# Patient Record
Sex: Female | Born: 2000 | Race: Black or African American | Hispanic: No | Marital: Single | Smoking: Never smoker
Health system: Southern US, Community
[De-identification: ages and names within clinical notes are randomized; demographics above are authoritative.]

## PROBLEM LIST (undated history)

## (undated) DIAGNOSIS — J302 Other seasonal allergic rhinitis: Secondary | ICD-10-CM

## (undated) DIAGNOSIS — J45909 Unspecified asthma, uncomplicated: Secondary | ICD-10-CM

## (undated) HISTORY — DX: Other seasonal allergic rhinitis: J30.2

## (undated) HISTORY — PX: WRIST SURGERY: SHX841

---
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
## 2018-04-13 ENCOUNTER — Ambulatory Visit (INDEPENDENT_AMBULATORY_CARE_PROVIDER_SITE_OTHER): Payer: 59 | Admitting: Family Medicine

## 2018-04-13 ENCOUNTER — Encounter: Payer: Self-pay | Admitting: Family Medicine

## 2018-04-13 VITALS — BP 110/70 | HR 67 | Temp 98.5°F | Resp 16 | Ht 66.0 in | Wt 144.0 lb

## 2018-04-13 DIAGNOSIS — J069 Acute upper respiratory infection, unspecified: Secondary | ICD-10-CM

## 2018-04-13 DIAGNOSIS — J45909 Unspecified asthma, uncomplicated: Secondary | ICD-10-CM | POA: Insufficient documentation

## 2018-04-13 DIAGNOSIS — J4520 Mild intermittent asthma, uncomplicated: Secondary | ICD-10-CM | POA: Diagnosis not present

## 2018-04-13 DIAGNOSIS — J309 Allergic rhinitis, unspecified: Secondary | ICD-10-CM | POA: Diagnosis not present

## 2018-04-13 DIAGNOSIS — H1010 Acute atopic conjunctivitis, unspecified eye: Secondary | ICD-10-CM | POA: Diagnosis not present

## 2018-04-13 DIAGNOSIS — J302 Other seasonal allergic rhinitis: Secondary | ICD-10-CM | POA: Insufficient documentation

## 2018-04-13 MED ORDER — BECLOMETHASONE DIPROPIONATE 80 MCG/ACT NA AERS: 2.0000 | INHALATION_SPRAY | NASAL | 5 refills | Status: DC

## 2018-04-13 MED ORDER — ALBUTEROL SULFATE HFA 108 (90 BASE) MCG/ACT IN AERS: 2.0000 | INHALATION_SPRAY | Freq: Four times a day (QID) | RESPIRATORY_TRACT | 0 refills | Status: DC | PRN

## 2018-04-13 NOTE — Patient Instructions (Signed)
I suspect your symptoms are related to a viral illness and recommend treating your symptoms at this point.  Mucinex DM for congestion and cough, drink extra water, use salt water gargles for throat irritation and Tylenol or Ibuprofen for aches and pains.   Use the steroid nasal spray for nasal congestion.  I refilled your albuterol inhaler.  You may also take Zyrtec.   Call if you are not improving by days 7-10 of your illness or if you develop fever, wheezing or worsening symptoms.

## 2018-04-13 NOTE — Progress Notes (Signed)
Chief Complaint  Patient presents with  . NP    np congestion, cough, drainage, ear ache X 1 week    Subjective:  Sandra Joseph is a 17 y.o. female who is new to the practice and presents for a one week history of nasal congestion, ears clogged, post nasal drainage, and cough. States her cough was initially productive of yellowish sputum but now clear and significantly improved.   Denies fever, chills, sinus pressure, headache, chest pain, shortness of breath, wheezing, abdominal pain, N/V/D.   Underlying asthma since childhood. Mild, intermittent. Seasonal allergies.  Does not smoke.   Works at [employer].   Treatment to date: Mucinex .  Denies sick contacts.  No other aggravating or relieving factors.  No other c/o.  LMP: March 23 2018. Is not sexually active.   ROS as in subjective.   Objective: Vitals:   04/13/18 1120  BP: 110/70  Pulse: 67  Resp: 16  Temp: 98.5 F (36.9 C)  SpO2: 99%    General appearance: Alert, WD/WN, no distress, mildly ill appearing                             Skin: warm, no rash                           Head: no sinus tenderness                            Eyes: conjunctiva normal, corneas clear, PERRLA                            Ears: pearly TMs, external ear canals normal                          Nose: septum midline, turbinates swollen, with erythema and clear discharge             Mouth/throat: MMM, tongue normal, mild pharyngeal erythema                           Neck: supple, no adenopathy, no thyromegaly, nontender                          Heart: RRR, normal S1, S2, no murmurs                         Lungs: CTA bilaterally, no wheezes, rales, or rhonchi      Assessment: Acute URI  Mild intermittent asthma without complication - Plan: albuterol (PROVENTIL HFA;VENTOLIN HFA) 108 (90 Base) MCG/ACT inhaler  Allergic rhinoconjunctivitis - Plan: Beclomethasone Dipropionate (QNASL) 80 MCG/ACT AERS    Plan: Discussed diagnosis and  treatment of URI. She is noticing improvement. Suggested symptomatic OTC remedies with Mucinex and Zyrtec and refilled nasal steroid spray as well as albuterol. Asthma is well controlled. No recent flares. Allergies seasonal.  Nasal saline spray for congestion.  Tylenol or Ibuprofen OTC for fever and malaise.  Call/return in 2-3 days if symptoms aren't resolving.

## 2018-08-27 ENCOUNTER — Other Ambulatory Visit: Payer: Self-pay | Admitting: Family Medicine

## 2018-08-27 DIAGNOSIS — J4520 Mild intermittent asthma, uncomplicated: Secondary | ICD-10-CM

## 2018-08-27 NOTE — Telephone Encounter (Signed)
Is this okay to refill? 

## 2018-08-30 IMAGING — CR DG CHEST 2V
2 series · 2 of 2 positions shown · non-contrast
Comparison: Chest radiograph performed 05/27/2008

CLINICAL DATA: Subacute onset of cough.  Fever.

EXAM:
CHEST  2 VIEW

[chest pa]
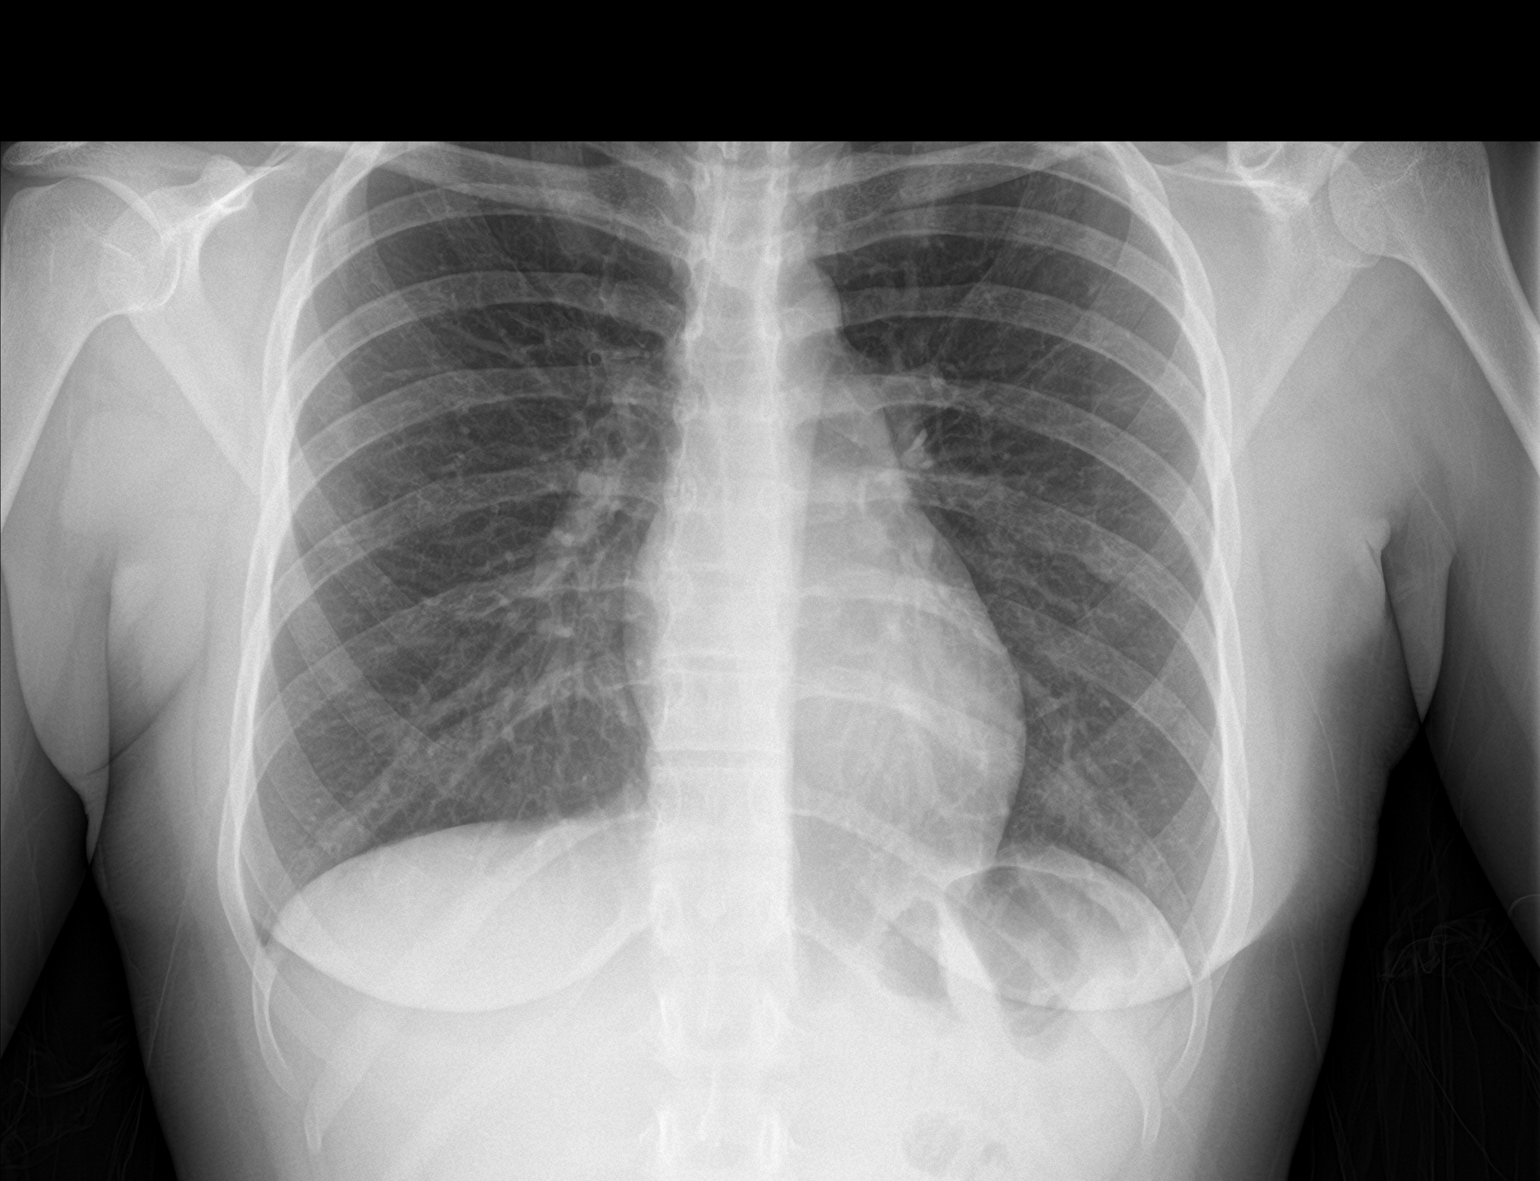

[chest lat]
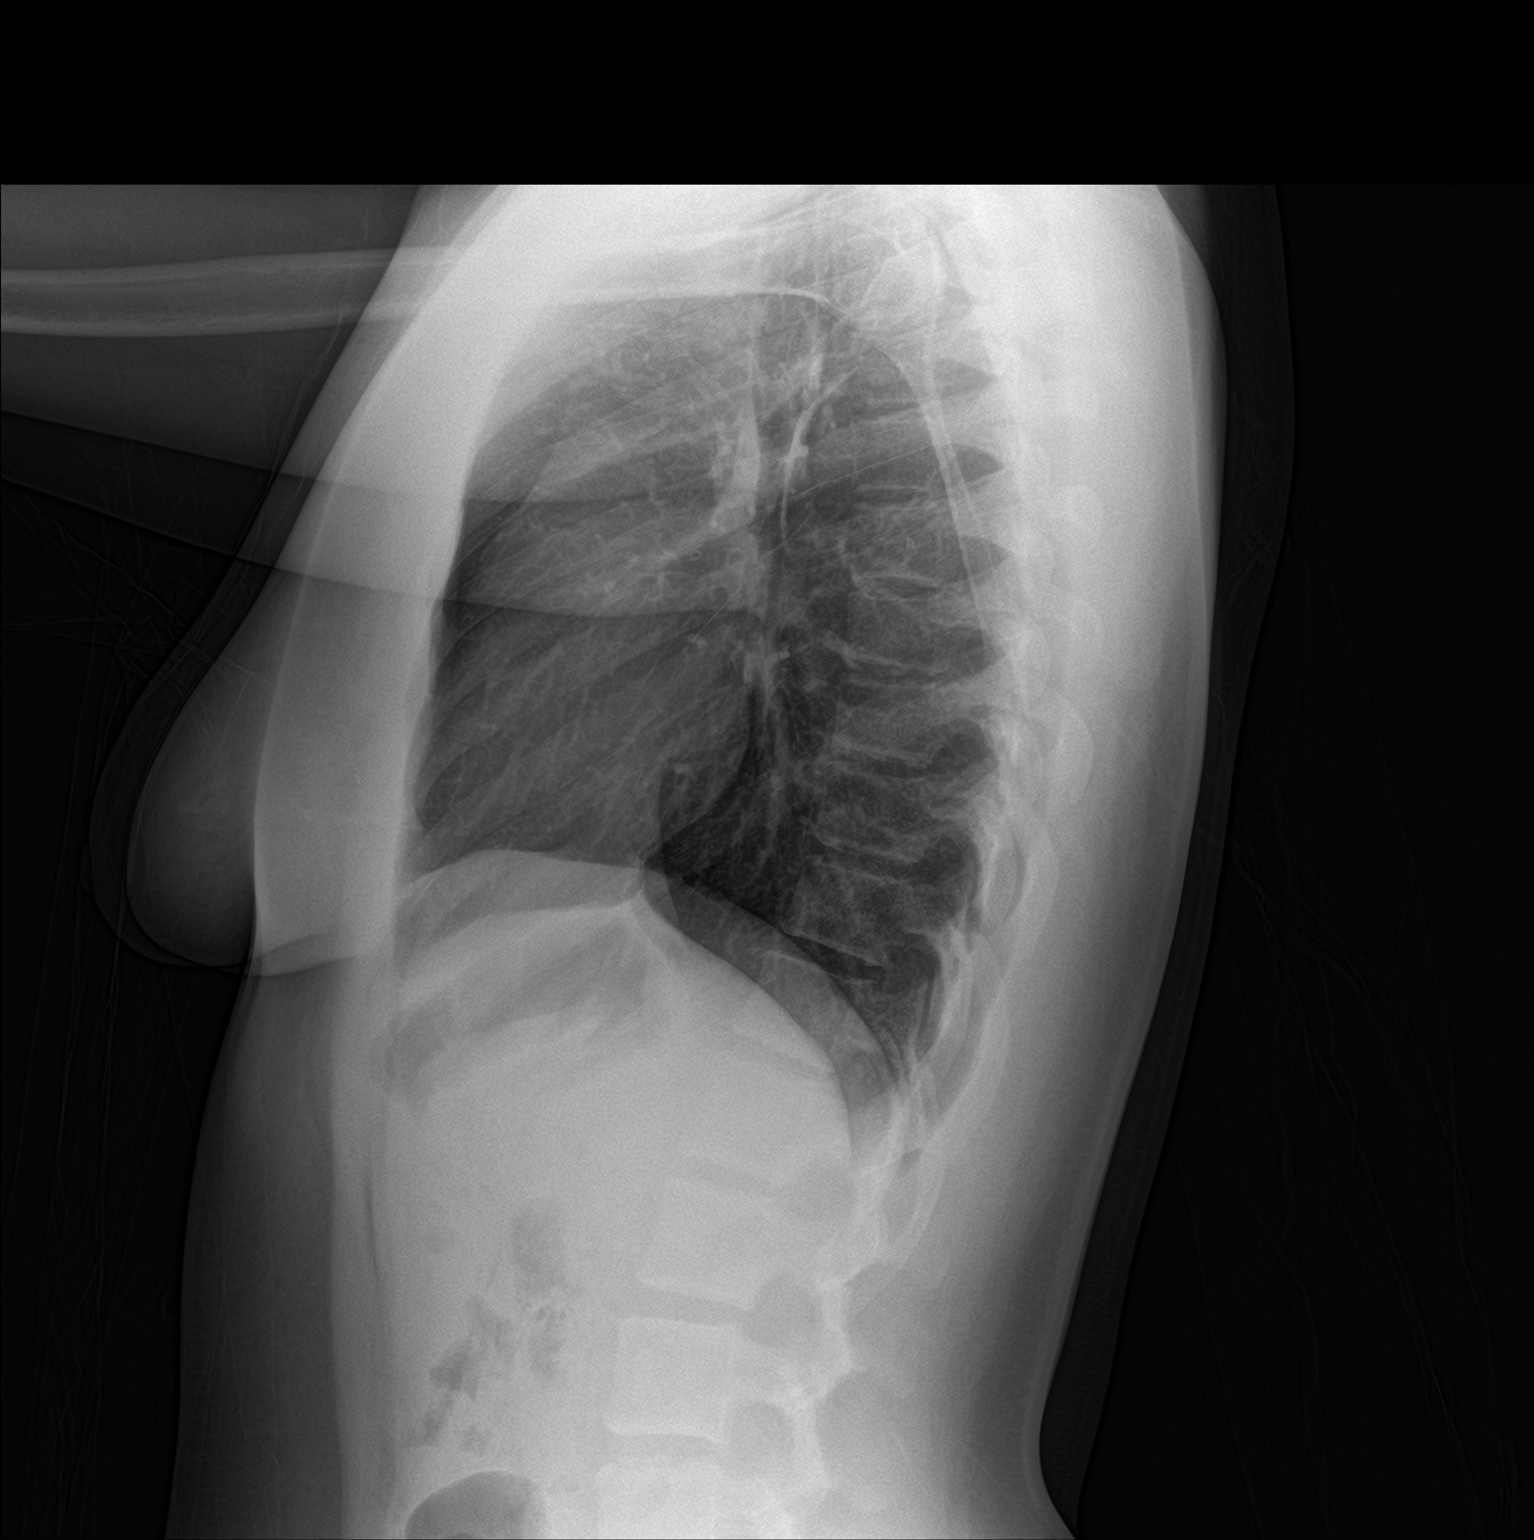

[2 of 2 positions shown; findings below may reference images not displayed]

FINDINGS: The lungs are well-aerated and clear. There is no evidence of focal
opacification, pleural effusion or pneumothorax.

The heart is normal in size; the mediastinal contour is within
normal limits. No acute osseous abnormalities are seen.
IMPRESSION: No acute cardiopulmonary process seen.

## 2019-01-24 ENCOUNTER — Encounter: Payer: Self-pay | Admitting: Family Medicine

## 2019-01-24 ENCOUNTER — Ambulatory Visit (INDEPENDENT_AMBULATORY_CARE_PROVIDER_SITE_OTHER): Payer: 59 | Admitting: Family Medicine

## 2019-01-24 ENCOUNTER — Other Ambulatory Visit: Payer: Self-pay

## 2019-01-24 VITALS — BP 120/70 | HR 64 | Temp 97.1°F | Ht 66.5 in | Wt 141.6 lb

## 2019-01-24 DIAGNOSIS — Z113 Encounter for screening for infections with a predominantly sexual mode of transmission: Secondary | ICD-10-CM | POA: Diagnosis not present

## 2019-01-24 DIAGNOSIS — Z0000 Encounter for general adult medical examination without abnormal findings: Secondary | ICD-10-CM | POA: Diagnosis not present

## 2019-01-24 DIAGNOSIS — Z23 Encounter for immunization: Secondary | ICD-10-CM | POA: Diagnosis not present

## 2019-01-24 NOTE — Progress Notes (Signed)
Subjective:    Patient ID: Sandra Joseph, female    DOB: [birth year], 18 y.o.   MRN: [id number]  HPI Chief Complaint  Patient presents with  . cpe    cpe with pap, end of july hit her head and been having headaches   She is fairly new to the practice and here for a complete physical exam. Previous medical care: no regular care in years  Last CPE: years ago   Other providers: None   Asthma- has needed albuterol inhaler 3 times over the past 1-2 weeks. Not at night.  Tightness in her chest while exerting herself and wearing a mask. Has never been on a preventive inhaler. Denies having symptoms currently.   Allergies are well controlled on current medication regimen.   Sexually active. Uses condoms  No hx of STIs.    Social history: Lives with her mother, works at [employer]  Denies smoking, drinking alcohol, drug use  Diet: fried foods  Excerise: 4 days per week   Immunizations: Tdap overdue   Health maintenance:  Last Gynecological Exam: never  Last Menstrual cycle: 01/14/2019 Periods are regular. Last for 5 days.  First period at age 18.  Pregnancies: 0 Last Dental Exam: last year  Last Eye Exam: winter 2019   Wears seatbelt always, smoke detectors in home and functioning, does not text while driving and feels safe in home environment.   Reviewed allergies, medications, past medical, surgical, family, and social history.    Review of Systems Review of Systems Constitutional: -fever, -chills, -sweats, -unexpected weight change,-fatigue ENT: -runny nose, -ear pain, -sore throat Cardiology:  -chest pain, -palpitations, -edema Respiratory: -cough, -shortness of breath, -wheezing Gastroenterology: -abdominal pain, -nausea, -vomiting, -diarrhea, -constipation  Hematology: -bleeding or bruising problems Musculoskeletal: -arthralgias, -myalgias, -joint swelling, -back pain Ophthalmology: -vision changes Urology: -dysuria, -difficulty urinating, -hematuria,  -urinary frequency, -urgency Neurology: -headache, -weakness, -tingling, -numbness       Objective:   Physical Exam BP 120/70   Pulse 64   Temp (!) 97.1 F (36.2 C)   Ht 5' 6.5" (1.689 m)   Wt 141 lb 9.6 oz (64.2 kg)   LMP 01/14/2019   BMI 22.51 kg/m   General Appearance:    Alert, cooperative, no distress, appears stated age  Head:    Normocephalic, without obvious abnormality, atraumatic  Eyes:    PERRL, conjunctiva/corneas clear, EOM's intact, fundi    benign  Ears:    Normal TM's and external ear canals  Nose:   Mask in place   Throat:   Mask in place   Neck:   Supple, no lymphadenopathy;  thyroid:  no   enlargement/tenderness/nodules; no carotid   bruit or JVD  Back:    Spine nontender, no curvature, ROM normal, no CVA     tenderness  Lungs:     Clear to auscultation bilaterally without wheezes, rales or     ronchi; respirations unlabored  Chest Wall:    No tenderness or deformity   Heart:    Regular rate and rhythm, S1 and S2 normal, no murmur, rub   or gallop  Breast Exam:    Declines   Abdomen:     Soft, non-tender, nondistended, normoactive bowel sounds,    no masses, no hepatosplenomegaly  Genitalia:    Normal external genitalia without lesions.  BUS and vagina normal; cervix without lesions, or cervical motion tenderness. Mild white-yellowish vaginal discharge.  Uterus and adnexa not enlarged, nontender, no masses.  Pap not indicated.  Rectal:    Not performed due to age<40 and no related complaints  Extremities:   No clubbing, cyanosis or edema  Pulses:   2+ and symmetric all extremities  Skin:   Skin color, texture, turgor normal, no rashes or lesions  Lymph nodes:   Cervical, supraclavicular, and axillary nodes normal  Neurologic:   CNII-XII intact, normal strength, sensation and gait; reflexes 2+ and symmetric throughout          Psych:   Normal mood, affect, hygiene and grooming.         Assessment & Plan:  Routine general medical examination at a  health care facility - Plan: CBC with Differential/Platelet, Comprehensive metabolic panel, T4, free, TSH, Lipid panel.  She is here today for a fasting CPE and is doing well.  Preventive healthcare screenings were discussed.  Discussed that Pap smears are recommended to begin at age 86.  Discussed healthy diet and exercise.  She appears to be taking good care of herself.  Discussed safety and health promotion.  Need for Tdap vaccination - Plan: Tdap vaccine greater than or equal to 7yo IM.  Counseling done on all components of vaccine.  Screen for STD (sexually transmitted disease) - Plan: RPR, HIV Antibody (routine testing w rflx), GC/Chlamydia Probe Amp.  Discussed continued condom use and safe sex practices.  Follow-up pending results.  Consider HPV vaccine once records are available to determine if she has had this.

## 2019-01-24 NOTE — Patient Instructions (Signed)
Preventive Care 18-21 Years Old, Female Preventive care refers to lifestyle choices and visits with your health care provider that can promote health and wellness. At this stage in your life, you may start seeing a primary care physician instead of a pediatrician. Your health care is now your responsibility. Preventive care for young adults includes:  A yearly physical exam. This is also called an annual wellness visit.  Regular dental and eye exams.  Immunizations.  Screening for certain conditions.  Healthy lifestyle choices, such as diet and exercise. What can I expect for my preventive care visit? Physical exam Your health care provider may check:  Height and weight. These may be used to calculate body mass index (BMI), which is a measurement that tells if you are at a healthy weight.  Heart rate and blood pressure.  Body temperature. Counseling Your health care provider may ask you questions about:  Past medical problems and family medical history.  Alcohol, tobacco, and drug use.  Home and relationship well-being.  Access to firearms.  Emotional well-being.  Diet, exercise, and sleep habits.  Sexual activity and sexual health.  Method of birth control.  Menstrual cycle.  Pregnancy history. What immunizations do I need?  Influenza (flu) vaccine  This is recommended every year. Tetanus, diphtheria, and pertussis (Tdap) vaccine  You may need a Td booster every 10 years. Varicella (chickenpox) vaccine  You may need this vaccine if you have not already been vaccinated. Human papillomavirus (HPV) vaccine  If recommended by your health care provider, you may need three doses over 6 months. Measles, mumps, and rubella (MMR) vaccine  You may need at least one dose of MMR. You may also need a second dose. Meningococcal conjugate (MenACWY) vaccine  One dose is recommended if you are 19-21 years old and a first-year college student living in a residence hall,  or if you have one of several medical conditions. You may also need additional booster doses. Pneumococcal conjugate (PCV13) vaccine  You may need this if you have certain conditions and were not previously vaccinated. Pneumococcal polysaccharide (PPSV23) vaccine  You may need one or two doses if you smoke cigarettes or if you have certain conditions. Hepatitis A vaccine  You may need this if you have certain conditions or if you travel or work in places where you may be exposed to hepatitis A. Hepatitis B vaccine  You may need this if you have certain conditions or if you travel or work in places where you may be exposed to hepatitis B. Haemophilus influenzae type b (Hib) vaccine  You may need this if you have certain risk factors. You may receive vaccines as individual doses or as more than one vaccine together in one shot (combination vaccines). Talk with your health care provider about the risks and benefits of combination vaccines. What tests do I need? Blood tests  Lipid and cholesterol levels. These may be checked every 5 years starting at age 20.  Hepatitis C test.  Hepatitis B test. Screening  Pelvic exam and Pap test. This may be done every 3 years starting at age 21.  Sexually transmitted disease (STD) testing, if you are at risk.  BRCA-related cancer screening. This may be done if you have a family history of breast, ovarian, tubal, or peritoneal cancers. Other tests  Tuberculosis skin test.  Vision and hearing tests.  Skin exam.  Breast exam. Follow these instructions at home: Eating and drinking   Eat a diet that includes fresh fruits and   vegetables, whole grains, lean protein, and low-fat dairy products.  Drink enough fluid to keep your urine pale yellow.  Do not drink alcohol if: ? Your health care provider tells you not to drink. ? You are pregnant, may be pregnant, or are planning to become pregnant. ? You are under the legal drinking age. In the  [place] the legal drinking age is 21.  If you drink alcohol: ? Limit how much you have to 0-1 drink a day. ? Be aware of how much alcohol is in your drink. In the [place] one drink equals one 12 oz bottle of beer (355 mL), one 5 oz glass of wine (148 mL), or one 1 oz glass of hard liquor (44 mL). Lifestyle  Take daily care of your teeth and gums.  Stay active. Exercise at least 30 minutes 5 or more days of the week.  Do not use any products that contain nicotine or tobacco, such as cigarettes, e-cigarettes, and chewing tobacco. If you need help quitting, ask your health care provider.  Do not use drugs.  If you are sexually active, practice safe sex. Use a condom or other form of birth control (contraception) in order to prevent pregnancy and STIs (sexually transmitted infections). If you plan to become pregnant, see your health care provider for a pre-conception visit.  Find healthy ways to cope with stress, such as: ? Meditation, yoga, or listening to music. ? Journaling. ? Talking to a trusted person. ? Spending time with friends and family. Safety  Always wear your seat belt while driving or riding in a vehicle.  Do not drive if you have been drinking alcohol. Do not ride with someone who has been drinking.  Do not drive when you are tired or distracted. Do not text while driving.  Wear a helmet and other protective equipment during sports activities.  If you have firearms in your house, make sure you follow all gun safety procedures.  Seek help if you have been bullied, physically abused, or sexually abused.  Use the Internet responsibly to avoid dangers such as online bullying and online sex predators. What's next?  Go to your health care provider once a year for a well check visit.  Ask your health care provider how often you should have your eyes and teeth checked.  Stay up to date on all vaccines. This information is not intended to replace advice given to you by  your health care provider. Make sure you discuss any questions you have with your health care provider. Document Released: 10/08/2015 Document Revised: 05/17/2018 Document Reviewed: 05/17/2018 Elsevier Patient Education  2020 [employer]  

## 2019-01-25 LAB — CBC WITH DIFFERENTIAL/PLATELET
Basophils Absolute: 0.1 10*3/uL (ref 0.0–0.2)
Basos: 1 %
EOS (ABSOLUTE): 0.8 10*3/uL — ABNORMAL HIGH (ref 0.0–0.4)
Eos: 15 %
Hematocrit: 41.5 % (ref 34.0–46.6)
Hemoglobin: 13.5 g/dL (ref 11.1–15.9)
Immature Grans (Abs): 0 10*3/uL (ref 0.0–0.1)
Immature Granulocytes: 0 %
Lymphocytes Absolute: 2.2 10*3/uL (ref 0.7–3.1)
Lymphs: 42 %
MCH: 28.2 pg (ref 26.6–33.0)
MCHC: 32.5 g/dL (ref 31.5–35.7)
MCV: 87 fL (ref 79–97)
Monocytes Absolute: 0.4 10*3/uL (ref 0.1–0.9)
Monocytes: 7 %
Neutrophils Absolute: 1.9 10*3/uL (ref 1.4–7.0)
Neutrophils: 35 %
Platelets: 248 10*3/uL (ref 150–450)
RBC: 4.78 x10E6/uL (ref 3.77–5.28)
RDW: 12.8 % (ref 11.7–15.4)
WBC: 5.3 10*3/uL (ref 3.4–10.8)

## 2019-01-25 LAB — COMPREHENSIVE METABOLIC PANEL
ALT: 9 IU/L (ref 0–32)
AST: 19 IU/L (ref 0–40)
Albumin/Globulin Ratio: 1.7 (ref 1.2–2.2)
Albumin: 4.8 g/dL (ref 3.9–5.0)
Alkaline Phosphatase: 59 IU/L (ref 39–117)
BUN/Creatinine Ratio: 22 (ref 9–23)
BUN: 18 mg/dL (ref 6–20)
Bilirubin Total: <0.2 mg/dL (ref 0.0–1.2)
CO2: 24 mmol/L (ref 20–29)
Calcium: 10 mg/dL (ref 8.7–10.2)
Chloride: 101 mmol/L (ref 96–106)
Creatinine, Ser: 0.82 mg/dL (ref 0.57–1.00)
GFR calc Af Amer: 119 mL/min/{1.73_m2} (ref >59)
GFR calc non Af Amer: 103 mL/min/{1.73_m2} (ref >59)
Globulin, Total: 2.9 g/dL (ref 1.5–4.5)
Glucose: 76 mg/dL (ref 65–99)
Potassium: 4.3 mmol/L (ref 3.5–5.2)
Sodium: 139 mmol/L (ref 134–144)
Total Protein: 7.7 g/dL (ref 6.0–8.5)

## 2019-01-25 LAB — T4, FREE: Free T4: 1.08 ng/dL (ref 0.82–1.77)

## 2019-01-25 LAB — LIPID PANEL
Chol/HDL Ratio: 2.3 ratio (ref 0.0–4.4)
Cholesterol, Total: 198 mg/dL (ref 100–199)
HDL: 88 mg/dL (ref >39)
LDL Calculated: 102 mg/dL — ABNORMAL HIGH (ref 0–99)
Triglycerides: 42 mg/dL (ref 0–149)
VLDL Cholesterol Cal: 8 mg/dL (ref 5–40)

## 2019-01-25 LAB — RPR: RPR Ser Ql: NONREACTIVE

## 2019-01-25 LAB — TSH: TSH: 1.82 u[IU]/mL (ref 0.450–4.500)

## 2019-01-25 LAB — HIV ANTIBODY (ROUTINE TESTING W REFLEX): HIV Screen 4th Generation wRfx: NONREACTIVE

## 2019-01-27 LAB — GC/CHLAMYDIA PROBE AMP
Chlamydia trachomatis, NAA: NEGATIVE
Neisseria Gonorrhoeae by PCR: NEGATIVE

## 2019-01-29 ENCOUNTER — Encounter: Payer: Self-pay | Admitting: Internal Medicine

## 2021-07-22 ENCOUNTER — Ambulatory Visit (INDEPENDENT_AMBULATORY_CARE_PROVIDER_SITE_OTHER): Payer: No Typology Code available for payment source | Admitting: Physician Assistant

## 2021-07-22 ENCOUNTER — Encounter: Payer: Self-pay | Admitting: Physician Assistant

## 2021-07-22 VITALS — BP 120/78 | HR 81 | Temp 98.1°F | Ht 66.5 in | Wt 192.6 lb

## 2021-07-22 DIAGNOSIS — S3140XA Unspecified open wound of vagina and vulva, initial encounter: Secondary | ICD-10-CM

## 2021-07-22 DIAGNOSIS — Z7251 High risk heterosexual behavior: Secondary | ICD-10-CM

## 2021-07-22 NOTE — Progress Notes (Signed)
Acute Office Visit  Subjective:    Patient ID: Sandra Joseph, female    DOB: 22-Apr-2001, 21 y.o.   MRN: [id number]  Chief Complaint  Patient presents with   other    STD testing, break out in pelvic area, started Monday morning, bumps with red places, itching and pain    HPI Patient is in today for a rash on external vaginal area; shaved about 1 1/2 - 2 weeks ago and noticed irritated skin; noticed that the rash changed and it looks like an ulcer that is sensitive only with touch while wiping; denies discharge or odor; is sexually active and sometimes uses condoms; not on birth control; LMP 07/07/2021;  Denies fever / chills / nausea / vomiting / diarrhea / constipation.    Past Medical History:  Diagnosis Date   Asthma    Seasonal allergies     Past Surgical History:  Procedure Laterality Date   WRIST SURGERY      Family History  Problem Relation Age of Onset   Kidney failure Mother        transplant    Hypertension Sister     Social History   Socioeconomic History   Marital status: Single    Spouse name: Not on file   Number of children: Not on file   Years of education: Not on file   Highest education level: Not on file  Occupational History   Not on file  Tobacco Use   Smoking status: Never   Smokeless tobacco: Never  Vaping Use   Vaping Use: Never used  Substance and Sexual Activity   Alcohol use: No   Drug use: No   Sexual activity: Yes    Birth control/protection: Condom  Other Topics Concern   Not on file  Social History Narrative   Not on file   Social Determinants of Health   Financial Resource Strain: Not on file  Food Insecurity: Not on file  Transportation Needs: Not on file  Physical Activity: Not on file  Stress: Not on file  Social Connections: Not on file  Intimate Partner Violence: Not on file    Outpatient Medications Prior to Visit  Medication Sig Dispense Refill   acetaminophen (TYLENOL) 325 MG tablet Take 650 mg by  mouth every 6 (six) hours as needed.     albuterol (PROVENTIL HFA;VENTOLIN HFA) 108 (90 Base) MCG/ACT inhaler TAKE 2 PUFFS BY MOUTH EVERY 6 HOURS AS NEEDED FOR WHEEZE OR SHORTNESS OF BREATH 6.7 Inhaler 0   cetirizine (ZYRTEC) 10 MG tablet Take 10 mg by mouth daily.     Beclomethasone Dipropionate (QNASL) 80 MCG/ACT AERS Place 2 sprays into both nostrils 1 day or 1 dose. (Patient not taking: Reported on 01/24/2019) 8.7 g 5   desloratadine (CLARINEX) 5 MG tablet Take 1 tablet (5 mg total) by mouth daily. (Patient not taking: Reported on 01/24/2019) 90 tablet 3   montelukast (SINGULAIR) 10 MG tablet Take 1 tablet (10 mg total) by mouth daily. (Patient not taking: Reported on 01/24/2019) 30 tablet 5   No facility-administered medications prior to visit.    Allergies  Allergen Reactions   Fexofenadine Rash and Hives    Review of Systems  Constitutional:  Negative for activity change and chills.  HENT:  Negative for congestion and voice change.   Eyes:  Negative for pain and redness.  Respiratory:  Negative for cough and wheezing.   Cardiovascular:  Negative for chest pain.  Gastrointestinal:  Negative for constipation,  diarrhea, nausea and vomiting.  Endocrine: Negative for polyuria.  Genitourinary:  Positive for genital sores. Negative for frequency, vaginal bleeding, vaginal discharge and vaginal pain.  Skin:  Negative for color change and rash.  Allergic/Immunologic: Negative for immunocompromised state.  Neurological:  Negative for dizziness.  Psychiatric/Behavioral:  Negative for agitation.       Objective:    Physical Exam Vitals and nursing note reviewed.  Constitutional:      General: She is not in acute distress.    Appearance: Normal appearance. She is not ill-appearing.  HENT:     Head: Normocephalic and atraumatic.     Right Ear: External ear normal.     Left Ear: External ear normal.     Nose: No congestion.  Eyes:     Extraocular Movements: Extraocular movements  intact.     Conjunctiva/sclera: Conjunctivae normal.     Pupils: Pupils are equal, round, and reactive to light.  Cardiovascular:     Rate and Rhythm: Normal rate and regular rhythm.     Pulses: Normal pulses.     Heart sounds: Normal heart sounds.  Pulmonary:     Effort: Pulmonary effort is normal.     Breath sounds: Normal breath sounds. No wheezing.  Abdominal:     General: Bowel sounds are normal.     Palpations: Abdomen is soft.  Genitourinary:    Labia:        Left: Lesion present. No tenderness.        Comments: Linear open lesions on left side of external labia Musculoskeletal:     Cervical back: Normal range of motion and neck supple.     Right lower leg: No edema.     Left lower leg: No edema.  Skin:    General: Skin is warm and dry.     Findings: No bruising.  Neurological:     General: No focal deficit present.     Mental Status: She is alert and oriented to person, place, and time.  Psychiatric:        Mood and Affect: Mood normal.        Behavior: Behavior normal.        Thought Content: Thought content normal.    BP 120/78    Pulse 81    Temp 98.1 F (36.7 C)    Wt 192 lb 9.6 oz (87.4 kg)    LMP 07/11/2021    BMI 30.62 kg/m   Wt Readings from Last 3 Encounters:  07/22/21 192 lb 9.6 oz (87.4 kg)  01/24/19 141 lb 9.6 oz (64.2 kg)  04/13/18 144 lb (65.3 kg) (75 %, Z= 0.68)*   * Growth percentiles are based on CDC (Girls, 2-20 Years) data.    Health Maintenance Due  Topic Date Due   COVID-19 Vaccine (1) Never done   HPV VACCINES (1 - 2-dose series) Never done   Hepatitis C Screening  Never done   PAP-Cervical Cytology Screening  Never done   PAP SMEAR-Modifier  Never done   CHLAMYDIA SCREENING  01/24/2020   INFLUENZA VACCINE  Never done       Topic Date Due   HPV VACCINES (1 - 2-dose series) Never done     Lab Results  Component Value Date   TSH 1.820 01/24/2019   Lab Results  Component Value Date   WBC 5.3 01/24/2019   HGB 13.5  01/24/2019   HCT 41.5 01/24/2019   MCV 87 01/24/2019   PLT 248 01/24/2019   Lab  Results  Component Value Date   NA 139 01/24/2019   K 4.3 01/24/2019   CO2 24 01/24/2019   GLUCOSE 76 01/24/2019   BUN 18 01/24/2019   CREATININE 0.82 01/24/2019   BILITOT <0.2 01/24/2019   ALKPHOS 59 01/24/2019   AST 19 01/24/2019   ALT 9 01/24/2019   PROT 7.7 01/24/2019   ALBUMIN 4.8 01/24/2019   CALCIUM 10.0 01/24/2019   Lab Results  Component Value Date   CHOL 198 01/24/2019   Lab Results  Component Value Date   HDL 88 01/24/2019   Lab Results  Component Value Date   LDLCALC 102 (H) 01/24/2019   Lab Results  Component Value Date   TRIG 42 01/24/2019   Lab Results  Component Value Date   CHOLHDL 2.3 01/24/2019   No results found for: HGBA1C     Assessment & Plan:   Problem List Items Addressed This Visit   None Visit Diagnoses     Open wound of genital labia, initial encounter    -  Primary   Relevant Orders   Herpes simplex virus culture   WOUND CULTURE   HSV(herpes simplex vrs) 1+2 ab-IgG   High risk sexual behavior, unspecified type       Relevant Orders   Herpes simplex virus culture   WOUND CULTURE   HSV(herpes simplex vrs) 1+2 ab-IgG      Wound culture, herpes culture, and lab for HSV !gG ordered. [facility] no longer has the HSV IgM available to be ordered.   Return in 1 month for annual exam with pap smear.   No orders of the defined types were placed in this encounter.   Jake Shark, PA-C

## 2021-07-23 LAB — HSV(HERPES SIMPLEX VRS) I + II AB-IGG
HSV 1 Glycoprotein G Ab, IgG: <0.91 index (ref 0.00–0.90)
HSV 2 IgG, Type Spec: <0.91 index (ref 0.00–0.90)

## 2021-07-25 LAB — HERPES SIMPLEX VIRUS CULTURE

## 2021-07-25 LAB — WOUND CULTURE

## 2021-07-26 ENCOUNTER — Other Ambulatory Visit: Payer: Self-pay | Admitting: Physician Assistant

## 2021-07-26 ENCOUNTER — Encounter: Payer: Self-pay | Admitting: Physician Assistant

## 2021-07-26 DIAGNOSIS — B009 Herpesviral infection, unspecified: Secondary | ICD-10-CM | POA: Insufficient documentation

## 2021-07-26 MED ORDER — VALACYCLOVIR HCL 1 G PO TABS: 1000.0000 mg | ORAL_TABLET | Freq: Every day | ORAL | 3 refills | Status: AC

## 2021-07-26 MED ORDER — VALACYCLOVIR HCL 1 G PO TABS: 1000.0000 mg | ORAL_TABLET | Freq: Two times a day (BID) | ORAL | 0 refills | Status: AC

## 2021-08-23 ENCOUNTER — Encounter: Payer: No Typology Code available for payment source | Admitting: Physician Assistant

## 2021-08-23 NOTE — Progress Notes (Deleted)
? ?Complete physical exam ? ? ?Patient: Sandra Joseph   DOB: 2001/03/04   21 y.o. Female  MRN: [id number] ?Visit Date: 08/23/2021 ? ?No chief complaint on file. ? ?Subjective  ?  ?Sandra Joseph is a 21 y.o. female who presents today for a complete physical exam.  ? ?Reports female is generally feeling well, fairly well, poorly; is eating a *** diet; is sleeping well ***; drinks *** bottles of water a day; is exercising at home at the gym, not exercising, has a strenuous job but no regular exercise; doesn't have any new issues to discuss  ? ? ?HPI  ?*** ? ?Past Medical History:  ?Diagnosis Date  ? Asthma   ? Seasonal allergies   ? ?Past Surgical History:  ?Procedure Laterality Date  ? WRIST SURGERY    ? ?Social History  ? ?Socioeconomic History  ? Marital status: Single  ?  Spouse name: Not on file  ? Number of children: Not on file  ? Years of education: Not on file  ? Highest education level: Not on file  ?Occupational History  ? Not on file  ?Tobacco Use  ? Smoking status: Never  ? Smokeless tobacco: Never  ?Vaping Use  ? Vaping Use: Never used  ?Substance and Sexual Activity  ? Alcohol use: No  ? Drug use: No  ? Sexual activity: Yes  ?  Birth control/protection: Condom  ?Other Topics Concern  ? Not on file  ?Social History Narrative  ? Not on file  ? ?Social Determinants of Health  ? ?Financial Resource Strain: Not on file  ?Food Insecurity: Not on file  ?Transportation Needs: Not on file  ?Physical Activity: Not on file  ?Stress: Not on file  ?Social Connections: Not on file  ?Intimate Partner Violence: Not on file  ? ?Family Status  ?Relation Name Status  ? Mother  Alive  ? Father  Alive  ? Sister  Alive  ? Sister  Alive  ? ?Family History  ?Problem Relation Age of Onset  ? Kidney failure Mother   ?     transplant   ? Hypertension Sister   ? ?Allergies  ?Allergen Reactions  ? Fexofenadine Rash and Hives  ?  ?Patient Care Team: ?Lexine Baton as PCP - General (Physician Assistant)   ? ?Medications: ?Outpatient Medications Prior to Visit  ?Medication Sig  ? acetaminophen (TYLENOL) 325 MG tablet Take 650 mg by mouth every 6 (six) hours as needed.  ? albuterol (PROVENTIL HFA;VENTOLIN HFA) 108 (90 Base) MCG/ACT inhaler TAKE 2 PUFFS BY MOUTH EVERY 6 HOURS AS NEEDED FOR WHEEZE OR SHORTNESS OF BREATH  ? Beclomethasone Dipropionate (QNASL) 80 MCG/ACT AERS Place 2 sprays into both nostrils 1 day or 1 dose. (Patient not taking: Reported on 01/24/2019)  ? cetirizine (ZYRTEC) 10 MG tablet Take 10 mg by mouth daily.  ? desloratadine (CLARINEX) 5 MG tablet Take 1 tablet (5 mg total) by mouth daily. (Patient not taking: Reported on 01/24/2019)  ? montelukast (SINGULAIR) 10 MG tablet Take 1 tablet (10 mg total) by mouth daily. (Patient not taking: Reported on 01/24/2019)  ? valACYclovir (VALTREX) 1000 MG tablet Take 1 tablet (1,000 mg total) by mouth daily. Fill second. Take 1 tablet by mouth once a day x 5 days for recurrent outbreaks.  ? ?No facility-administered medications prior to visit.  ? ? ?Review of Systems ? ?{Labs (Optional):23779} ? ?The ASCVD Risk score (Arnett DK, et al., 2019) failed to calculate for the following  reasons: ?  The 2019 ASCVD risk score is only valid for ages 84 to 53 ? ? Objective  ?  ?There were no vitals taken for this visit. ? ?{Show previous vital signs (optional):23777} ? ? ?Physical Exam  ?*** ? ?Last depression screening scores ?PHQ 2/9 Scores 01/24/2019  ?PHQ - 2 Score 0  ? ?Last fall risk screening ?Fall Risk  01/24/2019  ?Falls in the past year? 0  ?Number falls in past yr: 0  ?Injury with Fall? 0  ? ? ? ?No results found for any visits on 08/23/21. ? Assessment & Plan  ?  ?Routine Health Maintenance and Physical Exam ? ?Exercise Activities and Dietary recommendations ? Goals   ?None ?  ? ? ?Immunization History  ?Administered Date(s) Administered  ? Hpv-Unspecified 03/04/2013, 07/23/2015  ? Tdap 01/24/2019  ? ? ?Health Maintenance  ?Topic Date Due  ? COVID-19 Vaccine (1)  Never done  ? Hepatitis C Screening  Never done  ? PAP-Cervical Cytology Screening  Never done  ? PAP SMEAR-Modifier  Never done  ? CHLAMYDIA SCREENING  01/24/2020  ? INFLUENZA VACCINE  Never done  ? TETANUS/TDAP  01/23/2029  ? HPV VACCINES  Completed  ? HIV Screening  Completed  ? ? ?Discussed health benefits of physical activity, and encouraged her to engage in regular exercise appropriate for her age and condition. ? ?Problem List Items Addressed This Visit   ?None ?  ? ?No follow-ups on file.  ?  ? ?Jake Shark, PA-C  ?

## 2021-08-27 ENCOUNTER — Encounter: Payer: Self-pay | Admitting: Physician Assistant

## 2021-12-03 ENCOUNTER — Encounter: Payer: Self-pay | Admitting: Internal Medicine

## 2022-02-09 ENCOUNTER — Encounter: Payer: Self-pay | Admitting: Internal Medicine

## 2022-03-15 ENCOUNTER — Encounter: Payer: Self-pay | Admitting: Internal Medicine

## 2022-04-19 ENCOUNTER — Encounter: Payer: Self-pay | Admitting: Internal Medicine

## 2022-05-13 ENCOUNTER — Telehealth: Payer: Self-pay | Admitting: Nurse Practitioner

## 2022-05-13 NOTE — Telephone Encounter (Signed)
Pt called requesting refill on valcyclovir she is having same symptoms as back in March when she was seen by Orlie Pollen  Also, please let me know when she needs to be seen again if she does not need to be seen for this issue

## 2022-05-16 MED ORDER — VALACYCLOVIR HCL 1 G PO TABS: 1000.0000 mg | ORAL_TABLET | Freq: Two times a day (BID) | ORAL | 0 refills | Status: AC

## 2022-05-16 NOTE — Telephone Encounter (Signed)
Left message for pt to call to set up CPE appt

## 2022-05-17 NOTE — Progress Notes (Unsigned)
No chief complaint on file.   PMH, PSH, SH reviewed  Asthma and allergies. 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    ASSESSMENT/PLAN:   Schedule CPE with Sandra Joseph

## 2022-05-18 ENCOUNTER — Encounter: Payer: Self-pay | Admitting: Family Medicine

## 2022-05-18 ENCOUNTER — Ambulatory Visit (INDEPENDENT_AMBULATORY_CARE_PROVIDER_SITE_OTHER): Payer: No Typology Code available for payment source | Admitting: Family Medicine

## 2022-05-18 ENCOUNTER — Other Ambulatory Visit: Payer: Self-pay | Admitting: Family Medicine

## 2022-05-18 VITALS — BP 110/74 | HR 68 | Ht 66.0 in | Wt 173.0 lb

## 2022-05-18 DIAGNOSIS — J309 Allergic rhinitis, unspecified: Secondary | ICD-10-CM

## 2022-05-18 DIAGNOSIS — J4520 Mild intermittent asthma, uncomplicated: Secondary | ICD-10-CM

## 2022-05-18 DIAGNOSIS — H1010 Acute atopic conjunctivitis, unspecified eye: Secondary | ICD-10-CM

## 2022-05-18 DIAGNOSIS — R519 Headache, unspecified: Secondary | ICD-10-CM | POA: Diagnosis not present

## 2022-05-18 MED ORDER — QNASL 80 MCG/ACT NA AERS: 2.0000 | INHALATION_SPRAY | NASAL | 2 refills | Status: DC

## 2022-05-18 MED ORDER — MONTELUKAST SODIUM 10 MG PO TABS: 10.0000 mg | ORAL_TABLET | Freq: Every day | ORAL | 2 refills | Status: DC

## 2022-05-18 NOTE — Telephone Encounter (Signed)
Not covered, need alternative.

## 2022-05-18 NOTE — Telephone Encounter (Signed)
Not covered, need alternative.  

## 2022-05-18 NOTE — Patient Instructions (Addendum)
Your headaches are likely related to allergies and sinuses. Please restart your allergy medication. The nasal steroid can take a week to work, and the singulair can take up to 2 weeks to see the full effect. These should be used regularly. The zyrtec or clarinex work a little faster, and should help while waiting for those other medications to become effective. You back back off on this pill if your allergies seem better, rather than stopping the spray or the singulair. If you develop discolored mucus and increasing sinus pain, this could indicate an infection and you should follow up with [place]. If you find that your mucus is very thick and causes some cough, you might want to try some guaifenesin (mucinex, robitussin). Some people really like to do sinus rinses (neti-pot or sinus rinse kit) to help alleviate sinus pressure and clear them out.  You can also try using a decongestant such as sudafed when you have a headache, as needed (for sinus headache). Recently it came out that the over-the-counter phenylephrine isn't really effective.  Show your license and get the pseudoephedrine that is behind the pharmacy counter.   If you have persistent or worsening headaches, please return for re-evaluation, especially if you have any of the neurologic symptoms we discussed.

## 2022-06-03 ENCOUNTER — Encounter: Payer: Self-pay | Admitting: Nurse Practitioner

## 2022-06-03 ENCOUNTER — Other Ambulatory Visit (HOSPITAL_COMMUNITY)
Admission: RE | Admit: 2022-06-03 | Discharge: 2022-06-03 | Disposition: A | Discharge status: Home / Self Care | Payer: No Typology Code available for payment source | Source: Ambulatory Visit | Attending: Nurse Practitioner | Admitting: Nurse Practitioner

## 2022-06-03 ENCOUNTER — Ambulatory Visit (INDEPENDENT_AMBULATORY_CARE_PROVIDER_SITE_OTHER): Payer: No Typology Code available for payment source | Admitting: Nurse Practitioner

## 2022-06-03 VITALS — BP 118/74 | HR 68 | Temp 98.5°F | Ht 66.5 in | Wt 177.6 lb

## 2022-06-03 DIAGNOSIS — Z124 Encounter for screening for malignant neoplasm of cervix: Secondary | ICD-10-CM

## 2022-06-03 DIAGNOSIS — Z0000 Encounter for general adult medical examination without abnormal findings: Secondary | ICD-10-CM | POA: Diagnosis not present

## 2022-06-03 NOTE — Patient Instructions (Signed)
It was a pleasure seeing you today! Thank you for trusting me with your care.   If labs were collected today, you will see the results as soon as they become available in MyChart. I will review these labs once I have received all of the results and send you comments and recommendations, if any. If you have specific concerns, we can set up an appointment (virtual or in person) to go over details and come up with a plan together.   If you have received any referrals today, the office where the referral was made will be in contact with you to set up your appointment.   If you have received orders for imaging today, the imaging office will contact you to schedule this. Please note that some imaging requires a prior authorization from insurance, so an order is not a guarantee this will be covered by your insurance, but I want to assure you we will do our best to get this covered and if it is not, you will be notified and we can come up with an alternative plan.   If you take regular prescription medications, please contact your pharmacy for routine refill requests. They will send this directly to us.  If you were ordered new medication as a part of your examination and treatment today, please contact your pharmacy to determine the status. Many prescriptions require a prior authorization and the pharmacy will contact us to get this started. This may take a few days for approval or denial. If the medication is denied, we will work with you to try alternative medications.   If you have any questions or concerns, please do not hesitate to contact the office via telephone or MyChart.  MyChart messages are received by the CMA staff during regular business hours Monday through Friday and we do our best to respond in a timely manner.  If your request requires an appointment, the staff will gladly help set that up so that we have the time dedicated to ensure that your questions are appropriately answered.     

## 2022-06-03 NOTE — Progress Notes (Unsigned)
[name], DNP, AGNP-c [facility] [address] [place], [place] [address] Main Office [phone]  BP 118/74   Pulse 68   Temp 98.5 F (36.9 C)   Ht 5' 6.5" (1.689 m)   Wt 177 lb 9.6 oz (80.6 kg)   LMP 05/09/2022 (Exact Date)   BMI 28.24 kg/m    Subjective:    Patient ID: Sandra Joseph, female    DOB: 2001-04-29, 21 y.o.   MRN: [id number]  HPI: Sandra Joseph is a 21 y.o. female presenting on 06/03/2022 for comprehensive medical examination.   IMMUNIZATIONS:   {Flu:28474:o:"Flu vaccine completed elsewhere this season"} {Prevnar 13:28477:o:"Prevnar 13 N/A for this patient"} {Prevnar 20:28476:o:"Prevnar 20 N/A for this patient"} {Pneumovax 23:28475:o:"Pneumovax 23 N/A for this patient"} {Vac Shingrix:28478:o:"Shingrix N/A for this patient"} {HPV:28479:o:"HPV N/A for this patient"} {Tetanus:28480:o:"Tetanus completed in the last 10 years"}  HEALTH MAINTENANCE: Pap Smear {HM Status:28583:o} Mammogram {HM Status:28583:o} Colon Cancer Screening {HM Status:28583:o} Bone Density {HM Status:28583:o} STI Testing {HM Status:28583:o}  She reports regular vision exams q1-5y: {YES/NO:21197::"Yes "} She reports regular dental exams q 70m:  {YES/NO:21197::"Yes "} {Daily diet habits:20576::"The patient eats a regular, healthy diet."} She endorses exercise and/or activity of: {Blank single:19197::"***"}  She currently: {SESOCIALHISTORY:28582}  Current medical concerns include:{Blank single:19197::"none","***"}   {Ros; complete female:19594::"A comprehensive review of systems was negative."}  Most Recent Depression Screen:     06/03/2022   10:19 AM 01/24/2019   10:08 AM  Depression screen PHQ 2/9  Decreased Interest 0 0  Down, Depressed, Hopeless 0 0  PHQ - 2 Score 0 0   Most Recent Anxiety Screen:      No data to display         Most Recent Fall Screen:    06/03/2022   10:19 AM 01/24/2019   10:08 AM  Fall Risk   Falls in the past  year? 0 0  Number falls in past yr: 0 0  Injury with Fall? 0 0  Risk for fall due to : No Fall Risks   Follow up Falls evaluation completed     Past medical history, surgical history, medications, allergies, family history and social history reviewed with patient today and changes made to appropriate areas of the chart.  Past Medical History:  Past Medical History:  Diagnosis Date   Asthma    Seasonal allergies    Medications:  Current Outpatient Medications on File Prior to Visit  Medication Sig   albuterol (PROVENTIL HFA;VENTOLIN HFA) 108 (90 Base) MCG/ACT inhaler TAKE 2 PUFFS BY MOUTH EVERY 6 HOURS AS NEEDED FOR WHEEZE OR SHORTNESS OF BREATH   cetirizine (ZYRTEC) 10 MG tablet Take 10 mg by mouth daily.   fluticasone (FLONASE) 50 MCG/ACT nasal spray Place 2 sprays into both nostrils daily.   montelukast (SINGULAIR) 10 MG tablet Take 1 tablet (10 mg total) by mouth daily.   aspirin-acetaminophen-caffeine (EXCEDRIN MIGRAINE) 250-250-65 MG tablet Take 3 tablets by mouth every 6 (six) hours as needed for headache. (Patient not taking: Reported on 06/03/2022)   Aspirin-Salicylamide-Caffeine (BC HEADACHE POWDER PO) Take 1 each by mouth as needed. (Patient not taking: Reported on 06/03/2022)   desloratadine (CLARINEX) 5 MG tablet Take 1 tablet (5 mg total) by mouth daily. (Patient not taking: Reported on 01/24/2019)   No current facility-administered medications on file prior to visit.   Surgical History:  Past Surgical History:  Procedure Laterality Date   WRIST SURGERY     Allergies:  Allergies  Allergen Reactions   Fexofenadine  Rash and Hives   Family History:  Family History  Problem Relation Age of Onset   Kidney failure Mother        transplant    Hypertension Sister        Objective:    BP 118/74   Pulse 68   Temp 98.5 F (36.9 C)   Ht 5' 6.5" (1.689 m)   Wt 177 lb 9.6 oz (80.6 kg)   LMP 05/09/2022 (Exact Date)   BMI 28.24 kg/m   Wt Readings from Last 3  Encounters:  06/03/22 177 lb 9.6 oz (80.6 kg)  05/18/22 173 lb (78.5 kg)  07/22/21 192 lb 9.6 oz (87.4 kg)    Physical Exam  Results for orders placed or performed in visit on 07/22/21  Herpes simplex virus culture   Specimen: Other   [facility]     CD- [id number] V  Result Value Ref Range   HSV Culture/Type Comment (A)   WOUND CULTURE   Specimen: Wound   [facility]     CD- [id number] V  Result Value Ref Range   Gram Stain Result Final report    Organism ID, Bacteria Comment    Organism ID, Bacteria Comment    Aerobic Bacterial Culture Final report (A)    Organism ID, Bacteria Comment (A)    Organism ID, Bacteria Routine flora   HSV(herpes simplex vrs) 1+2 ab-IgG  Result Value Ref Range   HSV 1 Glycoprotein G Ab, IgG <0.91 0.00 - 0.90 index   HSV 2 IgG, Type Spec <0.91 0.00 - 0.90 index         Assessment & Plan:   Problem List Items Addressed This Visit   None      Follow up plan: No follow-ups on file.  NEXT PREVENTATIVE PHYSICAL DUE IN 1 YEAR.  PATIENT COUNSELING PROVIDED FOR ALL ADULT PATIENTS:  Consume a well balanced diet low in saturated fats, cholesterol, and moderation in carbohydrates.   This can be as simple as monitoring portion sizes and cutting back on sugary beverages such as soda and juice to start with.    Daily water consumption of at least 64 ounces.  Physical activity at least 180 minutes per week, if just starting out.   This can be as simple as taking the stairs instead of the elevator and walking 2-3 laps around the office  purposefully every day.   STD protection, partner selection, and regular testing if high risk.  Limited consumption of alcoholic beverages if alcohol is consumed.  For women, I recommend no more than 7 alcoholic beverages per week, spread out throughout the week.  Avoid "binge" drinking or consuming large quantities of alcohol in one setting.   Please let me know if you feel you may need help with reduction or quitting alcohol  consumption.   Avoidance of nicotine, if used.  Please let me know if you feel you may need help with reduction or quitting nicotine use.   Daily mental health attention.  This can be in the form of 5 minute daily meditation, prayer, journaling, yoga, reflection, etc.   Purposeful attention to your emotions and mental state can significantly improve your overall wellbeing and Health.  Please know that I am here to help you with all of your health care goals and am happy to work with you to find a solution that works best for you.  The greatest advice I have received with any changes in life are to take it one step at a time, that  even means if all you can focus on is the next 60 seconds, then do that and celebrate your victories.  With any changes in life, you will have set backs, and that is OK. The important thing to remember is, if you have a set back, it is not a failure, it is an opportunity to try again!  Health Maintenance Recommendations Screening Testing Mammogram Every 1 -2 years based on history and risk factors Starting at age 67 Pap Smear Ages 21-39 every 3 years Ages 37-65 every 5 years with HPV testing More frequent testing may be required based on results and history Colon Cancer Screening Every 1-10 years based on test performed, risk factors, and history Starting at age 69 Bone Density Screening Every 2-10 years based on history Starting at age 81 for women Recommendations for men differ based on medication usage, history, and risk factors AAA Screening One time ultrasound Men 56-85 years old who have every smoked Lung Cancer Screening Low Dose Lung CT every 12 months Age 12-80 years with a 30 pack-year smoking history who still smoke or who have quit within the last 15 years  Screening Labs Routine  Labs: Complete Blood Count (CBC), Complete Metabolic Panel (CMP), Cholesterol (Lipid Panel) Every 6-12 months based on history and medications May be recommended  more frequently based on current conditions or previous results Hemoglobin A1c Lab Every 3-12 months based on history and previous results Starting at age 10 or earlier with diagnosis of diabetes, high cholesterol, BMI >26, and/or risk factors Frequent monitoring for patients with diabetes to ensure blood sugar control Thyroid Panel (TSH w/ T3 & T4) Every 6 months based on history, symptoms, and risk factors May be repeated more often if on medication HIV One time testing for all patients 23 and older May be repeated more frequently for patients with increased risk factors or exposure Hepatitis C One time testing for all patients 43 and older May be repeated more frequently for patients with increased risk factors or exposure Gonorrhea, Chlamydia Every 12 months for all sexually active persons 13-24 years Additional monitoring may be recommended for those who are considered high risk or who have symptoms PSA Men 48-61 years old with risk factors Additional screening may be recommended from age 29-69 based on risk factors, symptoms, and history  Vaccine Recommendations Tetanus Booster All adults every 10 years Flu Vaccine All patients 6 months and older every year COVID Vaccine All patients 12 years and older Initial dosing with booster May recommend additional booster based on age and health history HPV Vaccine 2 doses all patients age 66-26 Dosing may be considered for patients over 26 Shingles Vaccine (Shingrix) 2 doses all adults 55 years and older Pneumonia (Pneumovax 23) All adults 65 years and older May recommend earlier dosing based on health history Pneumonia (Prevnar 61) All adults 65 years and older Dosed 1 year after Pneumovax 23  Additional Screening, Testing, and Vaccinations may be recommended on an individualized basis based on family history, health history, risk factors, and/or exposure.

## 2022-06-04 LAB — CBC WITH DIFFERENTIAL/PLATELET
Basophils Absolute: 0 10*3/uL (ref 0.0–0.2)
Basos: 0 %
EOS (ABSOLUTE): 0.2 10*3/uL (ref 0.0–0.4)
Eos: 3 %
Hematocrit: 39.6 % (ref 34.0–46.6)
Hemoglobin: 12.5 g/dL (ref 11.1–15.9)
Immature Grans (Abs): 0 10*3/uL (ref 0.0–0.1)
Immature Granulocytes: 0 %
Lymphocytes Absolute: 2 10*3/uL (ref 0.7–3.1)
Lymphs: 28 %
MCH: 28.1 pg (ref 26.6–33.0)
MCHC: 31.6 g/dL (ref 31.5–35.7)
MCV: 89 fL (ref 79–97)
Monocytes Absolute: 0.6 10*3/uL (ref 0.1–0.9)
Monocytes: 8 %
Neutrophils Absolute: 4.5 10*3/uL (ref 1.4–7.0)
Neutrophils: 61 %
Platelets: 224 10*3/uL (ref 150–450)
RBC: 4.45 x10E6/uL (ref 3.77–5.28)
RDW: 13.5 % (ref 11.7–15.4)
WBC: 7.4 10*3/uL (ref 3.4–10.8)

## 2022-06-04 LAB — VITAMIN D 25 HYDROXY (VIT D DEFICIENCY, FRACTURES): Vit D, 25-Hydroxy: 30.3 ng/mL (ref 30.0–100.0)

## 2022-06-04 LAB — COMPREHENSIVE METABOLIC PANEL
ALT: 13 IU/L (ref 0–32)
AST: 19 IU/L (ref 0–40)
Albumin/Globulin Ratio: 1.5 (ref 1.2–2.2)
Albumin: 4.3 g/dL (ref 4.0–5.0)
Alkaline Phosphatase: 65 IU/L (ref 44–121)
BUN/Creatinine Ratio: 24 — ABNORMAL HIGH (ref 9–23)
BUN: 16 mg/dL (ref 6–20)
Bilirubin Total: 0.4 mg/dL (ref 0.0–1.2)
CO2: 20 mmol/L (ref 20–29)
Calcium: 9.6 mg/dL (ref 8.7–10.2)
Chloride: 102 mmol/L (ref 96–106)
Creatinine, Ser: 0.68 mg/dL (ref 0.57–1.00)
Globulin, Total: 2.8 g/dL (ref 1.5–4.5)
Glucose: 90 mg/dL (ref 70–99)
Potassium: 4.6 mmol/L (ref 3.5–5.2)
Sodium: 136 mmol/L (ref 134–144)
Total Protein: 7.1 g/dL (ref 6.0–8.5)
eGFR: 125 mL/min/{1.73_m2} (ref >59)

## 2022-06-04 LAB — LIPID PANEL
Chol/HDL Ratio: 2.1 ratio (ref 0.0–4.4)
Cholesterol, Total: 197 mg/dL (ref 100–199)
HDL: 93 mg/dL (ref >39)
LDL Chol Calc (NIH): 92 mg/dL (ref 0–99)
Triglycerides: 67 mg/dL (ref 0–149)
VLDL Cholesterol Cal: 12 mg/dL (ref 5–40)

## 2022-06-04 LAB — HEMOGLOBIN A1C
Est. average glucose Bld gHb Est-mCnc: 111 mg/dL
Hgb A1c MFr Bld: 5.5 % (ref 4.8–5.6)

## 2022-06-06 NOTE — Assessment & Plan Note (Signed)

## 2022-06-08 LAB — CERVICOVAGINAL ANCILLARY ONLY
Comment: NEGATIVE
High risk HPV: NEGATIVE

## 2022-08-03 ENCOUNTER — Encounter: Payer: Self-pay | Admitting: Internal Medicine

## 2022-08-03 ENCOUNTER — Ambulatory Visit (INDEPENDENT_AMBULATORY_CARE_PROVIDER_SITE_OTHER): Payer: No Typology Code available for payment source | Admitting: Internal Medicine

## 2022-08-03 VITALS — BP 104/60 | HR 80 | Temp 97.9°F | Resp 20 | Ht 66.5 in | Wt 177.0 lb

## 2022-08-03 DIAGNOSIS — J4520 Mild intermittent asthma, uncomplicated: Secondary | ICD-10-CM

## 2022-08-03 DIAGNOSIS — T7800XA Anaphylactic reaction due to unspecified food, initial encounter: Secondary | ICD-10-CM

## 2022-08-03 DIAGNOSIS — J3089 Other allergic rhinitis: Secondary | ICD-10-CM | POA: Diagnosis not present

## 2022-08-03 DIAGNOSIS — H1013 Acute atopic conjunctivitis, bilateral: Secondary | ICD-10-CM

## 2022-08-03 DIAGNOSIS — H1010 Acute atopic conjunctivitis, unspecified eye: Secondary | ICD-10-CM

## 2022-08-03 MED ORDER — BUDESONIDE-FORMOTEROL FUMARATE 80-4.5 MCG/ACT IN AERO: 2.0000 | INHALATION_SPRAY | Freq: Two times a day (BID) | RESPIRATORY_TRACT | 12 refills | Status: AC

## 2022-08-03 MED ORDER — AZELASTINE HCL 0.1 % NA SOLN: 2.0000 | Freq: Two times a day (BID) | NASAL | 12 refills | Status: AC

## 2022-08-03 MED ORDER — ALBUTEROL SULFATE HFA 108 (90 BASE) MCG/ACT IN AERS: 2.0000 | INHALATION_SPRAY | Freq: Four times a day (QID) | RESPIRATORY_TRACT | 3 refills | Status: AC | PRN

## 2022-08-03 NOTE — Progress Notes (Signed)
New Patient Note  RE: Sandra Joseph MRN: [id number] DOB: May 03, 2001 Date of Office Visit: 08/03/2022  Consult requested by: Orma Render, NP Primary care provider: Orma Render, NP  Chief Complaint: Asthma (Sob, chest pain )  History of Present Illness: I had the pleasure of seeing Sandra Joseph for initial evaluation at the [facility] on 08/03/2022. She is a 22 y.o. female, who is referred here by Early, Coralee Pesa, NP for the evaluation of asthma.  History obtained from patient, chart review. Asthma History:  -Diagnosed at age as young child .  -Current symptoms include chest tightness and cough previously seasonally spring and summer, now exercise induced  3-4 times a week  daytime symptoms in past month, 0 nighttime awakenings in past month Using rescue inhaler last used summer 2023 -Limitations to daily activity: some - 0 ED visits, 0 UC visits and 0 oral steroids in the past year - 0 number of lifetime hospitalizations, 0 number of lifetime intubations.  - Identified Triggers:  rhinitis , exercise, and cold air - Up-to-date with pneumonia, vaccines. - History of prior pneumonias: denies  - History of prior COVID-19 infection: covid 19 2021 - Smoking exposure: denies  Previous Diagnostics:  - Prior PFTs or spirometry: 2017: FEV1: 2.98L  109%, FVC: 3.63L  118%,   - Most Recent AEC (06/03/22): 200 (800 2020) -Most Recent Chest Imaging: CXR on (06/06/17): normal  - Today's Asthma Control Test:  .   Management:  - Previously used therapies: singulair .  - Current regimen:  - Maintenance: singulair  - Rescue: Albuterol 2 puffs q4-6 hrs PRN, not using  prior to exercise   Chronic rhinitis: started as a young chiled Symptoms include: nasal congestion, rhinorrhea, post nasal drainage, sneezing, watery eyes, itchy eyes, and itchy nose, migraines when uncontrolled  Occurs seasonally-spring and summer  Potential triggers: pollen season, denies animal triggers   Treatments tried: singulair '10mg'$ , claritin, qnasl, claritin, zyrtec  Previous allergy testing: yes History of reflux/heartburn: no History of chronic sinusitis or sinus surgery: no Nonallergic triggers:  cold air     Concern for Food Allergy:  Food of concern: tree nuts  History of reaction: diagnosed by testing Previous allergy testing yes Eats egg, dairy, wheat, soy, fish, shellfish, peanuts, sesame without reactions Carries an epinephrine autoinjector: no, previously  Has food allergy action plan no     Assessment and Plan: [name] is a 22 y.o. female with: Allergy with anaphylaxis due to food  Mild intermittent asthma without complication - Plan: Spirometry with Graph  Allergic rhinoconjunctivitis - Plan: Allergy Test, Interdermal Allergy Test   Plan: Patient Instructions  Mild persistent asthma: Unclear control controlled  - your lung testing today looked great  PLAN:  - Spacer sample and demonstration provided. - Controller Inhaler: Start Symbicort 19mg  1 puff twice a day; Use In Block Therapy-Start if having respiratory symptoms.  Use for at least 1 week or until symptoms resolve. - Rinse mouth out after use - Rescue Inhaler: Albuterol (Proair/Ventolin) 2 puffs . Use  every 4-6 hours as needed for chest tightness, wheezing, or coughing.  Can also use 15 minutes prior to exercise if you have symptoms with activity. - Asthma is not controlled if:  - Symptoms are occurring >2 times a week OR  - >2 times a month nighttime awakenings  - You are requiring systemic steroids (prednisone/steroid injections) more than once per year  - Your require hospitalization for your asthma.  -  Please call the clinic to schedule a follow up if these symptoms arise  Chronic Rhinitis Seasonal and Perennial Allergic: Not well-controlled - allergy testing today: Grass pollen, weed pollen, tree pollen, mold, dust mite, cat, dog  - Prevention:  - allergen avoidance when possible -  consider allergy shots as long term control of your symptoms by teaching your immune system to be more tolerant of your allergy triggers  - Symptom control: - Continue Nasal Steroid Spray: Best results if used daily. - Options include Flonase (fluticasone), Nasocort (triamcinolone), Nasonex (mometasome) 1- 2 sprays in each nostril daily.  - All can be purchased over-the-counter if not covered by insurance. - Start Astelin (azelastine) 1-2 sprays in each nostril twice a day as needed for nasal congestion/itchy nose - Use less frequently if airway gets too dry. - Continue Singulair (Montelukast) '10mg'$  nightly.   - Discontinue if nightmares of behavior changes. - Continue Antihistamine: daily or daily as needed.   -Options include Zyrtec (Cetirizine) '10mg'$ , Claritin (Loratadine) '10mg'$ , Allegra (Fexofenadine) '180mg'$ , or Xyzal (Levocetirinze) '5mg'$  - Can be purchased over-the-counter if not covered by insurance.  Allergic Conjunctivitis:  - Consider Allergy Eye drops-great options include Pataday (Olopatadine) or Zaditor (ketotifen) for eye symptoms daily as needed-both sold over the counter if not covered by insurance. and Rewetting Drops such as Systane,TheraTears, etc  -Avoid eye drops that say red eye relief as they may contain medications that dry out your eyes.  Food allergy:  - today's skin testing was positive to walnut and pecan, negative to all other foods - please strictly avoid tree nuts, if you are interested in reintroducing we should get blood work at some point consider food challenge, but for now continue to strictly avoid - for SKIN only reaction, okay to take Benadryl 2 capsules every 4 hours - for SKIN + ANY additional symptoms, OR IF concern for LIFE THREATENING reaction = Epipen Autoinjector EpiPen 0.3 mg. - If using Epinephrine autoinjector, call 911 - A food allergy action plan has been provided and discussed. - Medic Alert identification is recommended.  Follow up:   Thank  you so much for letting me partake in your care today.  Don't hesitate to reach out if you have any additional concerns!  Roney Marion, MD  [facility] [place] [place]  Reducing Pollen Exposure  The American Academy of Allergy, Asthma and Immunology suggests the following steps to reduce your exposure to pollen during allergy seasons.    Do not hang sheets or clothing out to dry; pollen may collect on these items. Do not mow lawns or spend time around freshly cut grass; mowing stirs up pollen. Keep windows closed at night.  Keep car windows closed while driving. Minimize morning activities outdoors, a time when pollen counts are usually at their highest. Stay indoors as much as possible when pollen counts or humidity is high and on windy days when pollen tends to remain in the air longer. Use air conditioning when possible.  Many air conditioners have filters that trap the pollen spores. Use a HEPA room air filter to remove pollen form the indoor air you breathe.   DUST MITE AVOIDANCE MEASURES:  There are three main measures that need and can be taken to avoid house dust mites:  Reduce accumulation of dust in general -reduce furniture, clothing, carpeting, books, stuffed animals, especially in bedroom  Separate yourself from the dust -use pillow and mattress encasements (can be found at stores such as [employer]  or online) -avoid direct exposure to air condition flow -use a HEPA filter device, especially in the bedroom; you can also use a HEPA filter vacuum cleaner -wipe dust with a moist towel instead of a dry towel or broom when cleaning  Decrease mites and/or their secretions -wash clothing and linen and stuffed animals at highest temperature possible, at least every 2 weeks -stuffed animals can also be placed in a bag and put in a freezer overnight  Despite the above measures, it is impossible to eliminate dust mites or their allergen completely from  your home.  With the above measures the burden of mites in your home can be diminished, with the goal of minimizing your allergic symptoms.  Success will be reached only when implementing and using all means together.  Control of Dog or Cat Allergen  Avoidance is the best way to manage a dog or cat allergy. If you have a dog or cat and are allergic to dog or cats, consider removing the dog or cat from the home. If you have a dog or cat but don't want to find it a new home, or if your family wants a pet even though someone in the household is allergic, here are some strategies that may help keep symptoms at bay:  Keep the pet out of your bedroom and restrict it to only a few rooms. Be advised that keeping the dog or cat in only one room will not limit the allergens to that room. Don't pet, hug or kiss the dog or cat; if you do, wash your hands with soap and water. High-efficiency particulate air (HEPA) cleaners run continuously in a bedroom or living room can reduce allergen levels over time. Regular use of a high-efficiency vacuum cleaner or a central vacuum can reduce allergen levels. Giving your dog or cat a bath at least once a week can reduce airborne allergen.  Control of Mold Allergen   Mold and fungi can grow on a variety of surfaces provided certain temperature and moisture conditions exist.  Outdoor molds grow on plants, decaying vegetation and soil.  The major outdoor mold, Alternaria and Cladosporium, are found in very high numbers during hot and dry conditions.  Generally, a late Summer - Fall peak is seen for common outdoor fungal spores.  Rain will temporarily lower outdoor mold spore count, but counts rise rapidly when the rainy period ends.  The most important indoor molds are Aspergillus and Penicillium.  Dark, humid and poorly ventilated basements are ideal sites for mold growth.  The next most common sites of mold growth are the bathroom and the kitchen.  Outdoor (Seasonal) Mold  Control  Positive outdoor molds via skin testing: Alternaria, Cladosporium, Bipolaris (Helminthsporium), Drechslera (Curvalaria), Mucor, and Epicoccum  Use air conditioning and keep windows closed Avoid exposure to decaying vegetation. Avoid leaf raking. Avoid grain handling. Consider wearing a face mask if working in moldy areas.    Indoor (Perennial) Mold Control   Positive indoor molds via skin testing: Aspergillus, Penicillium, Fusarium, Aureobasidium (Pullulara), Rhizopus, and Phoma  Maintain humidity below 50%. Clean washable surfaces with 5% bleach solution. Remove sources e.g. contaminated carpets.     Meds ordered this encounter  Medications   azelastine (ASTELIN) 0.1 % nasal spray    Sig: Place 2 sprays into both nostrils 2 (two) times daily. Use in each nostril as directed    Dispense:  30 mL    Refill:  12   albuterol (VENTOLIN HFA) 108 (90 Base) MCG/ACT  inhaler    Sig: Inhale 2 puffs into the lungs every 6 (six) hours as needed for wheezing or shortness of breath.    Dispense:  18 g    Refill:  3   budesonide-formoterol (SYMBICORT) 80-4.5 MCG/ACT inhaler    Sig: Inhale 2 puffs into the lungs in the morning and at bedtime.    Dispense:  1 each    Refill:  12   Lab Orders  No laboratory test(s) ordered today    Other allergy screening: Asthma: yes Rhino conjunctivitis: yes Food allergy: yes Medication allergy: no Hymenoptera allergy: no Urticaria: no Eczema:no History of recurrent infections suggestive of immunodeficency: no  Diagnostics: Spirometry:  Tracings reviewed. Her effort: Good reproducible efforts. FVC: 3.69L FEV1: 2.87L, 92% predicted FEV1/FVC ratio: 78% Interpretation: Spirometry consistent with normal pattern.  Please see scanned spirometry results for details.  Skin Testing: Environmental allergy panel and select foods.  Epicutaneous Testing: Grass pollen, weed pollen, tree pollen, mold, dust mite, cat, dog Food testing positive  to walnut, pecan  Intradermal Testing: Positive to mold mix 1, 2, 3, 4, ragweed negative to cockroach  adequate controls  Results interpreted by myself and discussed with patient/family.  Airborne Adult Perc - 08/03/22 1116     Time Antigen Placed 1116    Allergen Manufacturer Lavella Hammock    Location Back    Number of Test 58    1. Control-Buffer 50% Glycerol Negative    2. Control-Histamine 1 mg/ml 3+    3. Albumin saline Negative    4. [place] 4+    5. [place] 4+    6. [place] 4+    7. [place] Blue 4+    9. Perennial Rye 4+    10. Sweet Vernal 4+    11. Timothy Negative    12. Cocklebur 3+    13. Burweed Marshelder 3+    14. Ragweed, short Negative    15. Ragweed, Giant Negative    16. Plantain,  English 4+    17. [place] 4+    18. Sheep [place] 3+    19. Rough Pigweed Negative    20. Marsh Elder, Rough Negative    21. Mugwort, Common Negative    22. Ash mix 4+    23. Birch mix 4+    24. Beech American 4+    25. Box, Elder 4+    26. Cedar, red 3+    27. Cottonwood, Eastern 4+    28. Elm mix 3+    29. Hickory 4+    30. Maple mix 4+    31. Oak, [place] mix 4+    32. Pecan Pollen 4+    33. Pine mix 3+    34. Sycamore Eastern 4+    35. [place], Black Pollen 4+    36. Alternaria alternata Negative    37. Cladosporium Herbarum Negative    38. Aspergillus mix Negative    39. Penicillium mix Negative    40. Bipolaris sorokiniana (Helminthosporium) Negative    41. Drechslera spicifera (Curvularia) Negative    42. Mucor plumbeus Negative    43. Fusarium moniliforme Negative    44. Aureobasidium pullulans (pullulara) Negative    45. Rhizopus oryzae Negative    46. Botrytis cinera Negative    47. Epicoccum nigrum 3+    48. Phoma betae 3+    49. Candida Albicans Negative    50. Trichophyton mentagrophytes Negative    51. Mite, D Farinae  5,000 AU/ml 4+    52. Mite, D  Pteronyssinus  5,000 AU/ml 4+    53. Cat Hair 10,000 BAU/ml 4+    54.  Dog Epithelia 3+    55.  Mixed Feathers Negative    56. Horse Epithelia Negative    57. Cockroach, German Negative    58. Mouse Negative    59. Tobacco Leaf 3+             Intradermal - 08/03/22 1129     Time Antigen Placed 1129    Allergen Manufacturer Other    Location Arm    Number of Test 7    Control Negative    Ragweed mix 3+    Mold 1 3+    Mold 2 3+    Mold 3 3+    Cat 3+    Cockroach Negative             Food Adult Perc - 08/03/22 1100     Time Antigen Placed 1116    Allergen Manufacturer Lavella Hammock    Location Back    Number of allergen test 17    1. Peanut Negative    2. Soybean Negative    3. Wheat Negative    4. Sesame Negative    5. Milk, cow Negative    6. Egg White, Chicken Negative    7. Casein Negative    8. Shellfish Mix Negative    9. Fish Mix Negative    10. Cashew Negative    11. Pecan Food --   5x10   12. [place] --   3x10   13. Almond Negative    14. Hazelnut Negative    15. [place] nut Negative    16. Coconut Negative    17. 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 18 g 3   aspirin-acetaminophen-caffeine (EXCEDRIN MIGRAINE) [id number] MG tablet Take 3 tablets by mouth every 6 (six) hours as needed for headache.     azelastine (ASTELIN) 0.1 %  nasal spray Place 2 sprays into both nostrils 2 (two) times daily. Use in each nostril as directed 30 mL 12   budesonide-formoterol (SYMBICORT) 80-4.5 MCG/ACT inhaler Inhale 2 puffs into the lungs in the morning and at bedtime. 1 each 12   cetirizine (ZYRTEC) 10 MG tablet Take 10 mg by mouth daily.     cyclobenzaprine (FLEXERIL) 5 MG tablet Take by mouth.     fluticasone (FLONASE) 50 MCG/ACT nasal spray Place 2 sprays into both nostrils daily. 16 g 2   montelukast (SINGULAIR) 10 MG tablet Take 1 tablet (10 mg total) by mouth daily. 30 tablet 2   No current facility-administered medications for this visit.   Allergies: Allergies  Allergen Reactions   Fexofenadine Rash and  Hives   Social History: Social History   Socioeconomic History   Marital status: Single    Spouse name: Not on file   Number of children: Not on file   Years of education: Not on file   Highest education level: Not on file  Occupational History   Not on file  Tobacco Use   Smoking status: Never    Passive exposure: Never   Smokeless tobacco: Never  Vaping Use   Vaping Use: Never used  Substance and Sexual Activity   Alcohol use: No   Drug use: No   Sexual activity: Yes    Birth control/protection: Condom  Other Topics Concern   Not on file  Social History Narrative   Not on file   Social Determinants of Health   Financial Resource Strain: Not on file  Food Insecurity: Not on file  Transportation Needs: Not on file  Physical Activity: Not on file  Stress: Not on file  Social Connections: Not on file   Lives in a single-family home that is 24 years old.  There are no roaches in the house and bed is 2 feet off the floor.  There are dust mite precautions on bed and pillows.  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 Smoking: No exposure Occupation: Works as a Freight forwarder at [employer], includes lifting,  bending, walking, Public librarian History: Environmental education officer in the house: no Charity fundraiser in the family room: yes Carpet in the bedroom: no Heating: electric Cooling: central Pet: yes 1 dog with access to bedroom  Family History: Family History  Problem Relation Age of Onset   Kidney failure Mother        transplant    Hypertension Sister    Asthma Neg Hx    Allergic rhinitis Neg Hx    Urticaria Neg Hx    Immunodeficiency Neg Hx    Eczema Neg Hx    Angioedema Neg Hx    Atopy Neg Hx      ROS: All others negative except as noted per HPI.   Objective: BP 104/60   Pulse 80   Temp 97.9 F (36.6 C) (Temporal)   Resp 20   Ht 5' 6.5" (1.689 m)   Wt 177 lb (80.3 kg)   SpO2 99%   BMI 28.14 kg/m  Body mass index is 28.14 kg/m.  General Appearance:  Alert, cooperative, no distress, appears stated age  Head:  Normocephalic, without obvious abnormality, atraumatic  Eyes:  Conjunctiva clear, EOM's intact  Nose: Nares normal, normal mucosa, no visible anterior polyps, and septum midline  Throat: Lips, tongue normal; teeth and gums normal, no tonsillar exudate and + cobblestoning  Neck: Supple, symmetrical  Lungs:   clear to auscultation bilaterally, Respirations unlabored, no coughing  Heart:  regular rate and rhythm and no murmur, Appears well perfused  Extremities: No edema  Skin: Skin color, texture, turgor normal, no rashes or lesions on visualized portions of skin  Neurologic: No gross deficits   The plan was reviewed with the patient/family, and all questions/concerned were addressed.  It was my pleasure to see Munachimso today and participate in her care. Please feel free to contact me with any questions or concerns.  Sincerely,  Roney Marion, MD Allergy & Immunology  [facility] office: [phone] [facility] office: [phone]

## 2022-08-03 NOTE — Patient Instructions (Addendum)
Mild persistent asthma: Unclear control controlled  - your lung testing today looked great  PLAN:  - Spacer sample and demonstration provided. - Controller Inhaler: Start Symbicort 34mg  1 puff twice a day; Use In Block Therapy-Start if having respiratory symptoms.  Use for at least 1 week or until symptoms resolve. - Rinse mouth out after use - Rescue Inhaler: Albuterol (Proair/Ventolin) 2 puffs . Use  every 4-6 hours as needed for chest tightness, wheezing, or coughing.  Can also use 15 minutes prior to exercise if you have symptoms with activity. - Asthma is not controlled if:  - Symptoms are occurring >2 times a week OR  - >2 times a month nighttime awakenings  - You are requiring systemic steroids (prednisone/steroid injections) more than once per year  - Your require hospitalization for your asthma.  - Please call the clinic to schedule a follow up if these symptoms arise  Chronic Rhinitis Seasonal and Perennial Allergic: Not well-controlled - allergy testing today: Grass pollen, weed pollen, tree pollen, mold, dust mite, cat, dog  - Prevention:  - allergen avoidance when possible - consider allergy shots as long term control of your symptoms by teaching your immune system to be more tolerant of your allergy triggers  - Symptom control: - Continue Nasal Steroid Spray: Best results if used daily. - Options include Flonase (fluticasone), Nasocort (triamcinolone), Nasonex (mometasome) 1- 2 sprays in each nostril daily.  - All can be purchased over-the-counter if not covered by insurance. - Start Astelin (azelastine) 1-2 sprays in each nostril twice a day as needed for nasal congestion/itchy nose - Use less frequently if airway gets too dry. - Continue Singulair (Montelukast) '10mg'$  nightly.   - Discontinue if nightmares of behavior changes. - Continue Antihistamine: daily or daily as needed.   -Options include Zyrtec (Cetirizine) '10mg'$ , Claritin (Loratadine) '10mg'$ , Allegra  (Fexofenadine) '180mg'$ , or Xyzal (Levocetirinze) '5mg'$  - Can be purchased over-the-counter if not covered by insurance.  Allergic Conjunctivitis:  - Consider Allergy Eye drops-great options include Pataday (Olopatadine) or Zaditor (ketotifen) for eye symptoms daily as needed-both sold over the counter if not covered by insurance. and Rewetting Drops such as Systane,TheraTears, etc  -Avoid eye drops that say red eye relief as they may contain medications that dry out your eyes.  Food allergy:  - today's skin testing was positive to walnut and pecan, negative to all other foods - please strictly avoid tree nuts, if you are interested in reintroducing we should get blood work at some point consider food challenge, but for now continue to strictly avoid - for SKIN only reaction, okay to take Benadryl 2 capsules every 4 hours - for SKIN + ANY additional symptoms, OR IF concern for LIFE THREATENING reaction = Epipen Autoinjector EpiPen 0.3 mg. - If using Epinephrine autoinjector, call 911 - A food allergy action plan has been provided and discussed. - Medic Alert identification is recommended.  Follow up:   Thank you so much for letting me partake in your care today.  Don't hesitate to reach out if you have any additional concerns!  ERoney Marion MD  [facility] [place], [place]  Reducing Pollen Exposure  The American Academy of Allergy, Asthma and Immunology suggests the following steps to reduce your exposure to pollen during allergy seasons.    Do not hang sheets or clothing out to dry; pollen may collect on these items. Do not mow lawns or spend time around freshly cut grass; mowing stirs up pollen. Keep windows  closed at night.  Keep car windows closed while driving. Minimize morning activities outdoors, a time when pollen counts are usually at their highest. Stay indoors as much as possible when pollen counts or humidity is high and on windy days when pollen tends to  remain in the air longer. Use air conditioning when possible.  Many air conditioners have filters that trap the pollen spores. Use a HEPA room air filter to remove pollen form the indoor air you breathe.   DUST MITE AVOIDANCE MEASURES:  There are three main measures that need and can be taken to avoid house dust mites:  Reduce accumulation of dust in general -reduce furniture, clothing, carpeting, books, stuffed animals, especially in bedroom  Separate yourself from the dust -use pillow and mattress encasements (can be found at stores such as [employer] or online) -avoid direct exposure to air condition flow -use a HEPA filter device, especially in the bedroom; you can also use a HEPA filter vacuum cleaner -wipe dust with a moist towel instead of a dry towel or broom when cleaning  Decrease mites and/or their secretions -wash clothing and linen and stuffed animals at highest temperature possible, at least every 2 weeks -stuffed animals can also be placed in a bag and put in a freezer overnight  Despite the above measures, it is impossible to eliminate dust mites or their allergen completely from your home.  With the above measures the burden of mites in your home can be diminished, with the goal of minimizing your allergic symptoms.  Success will be reached only when implementing and using all means together.  Control of Dog or Cat Allergen  Avoidance is the best way to manage a dog or cat allergy. If you have a dog or cat and are allergic to dog or cats, consider removing the dog or cat from the home. If you have a dog or cat but don't want to find it a new home, or if your family wants a pet even though someone in the household is allergic, here are some strategies that may help keep symptoms at bay:  Keep the pet out of your bedroom and restrict it to only a few rooms. Be advised that keeping the dog or cat in only one room will not limit the allergens to that room. Don't  pet, hug or kiss the dog or cat; if you do, wash your hands with soap and water. High-efficiency particulate air (HEPA) cleaners run continuously in a bedroom or living room can reduce allergen levels over time. Regular use of a high-efficiency vacuum cleaner or a central vacuum can reduce allergen levels. Giving your dog or cat a bath at least once a week can reduce airborne allergen.  Control of Mold Allergen   Mold and fungi can grow on a variety of surfaces provided certain temperature and moisture conditions exist.  Outdoor molds grow on plants, decaying vegetation and soil.  The major outdoor mold, Alternaria and Cladosporium, are found in very high numbers during hot and dry conditions.  Generally, a late Summer - Fall peak is seen for common outdoor fungal spores.  Rain will temporarily lower outdoor mold spore count, but counts rise rapidly when the rainy period ends.  The most important indoor molds are Aspergillus and Penicillium.  Dark, humid and poorly ventilated basements are ideal sites for mold growth.  The next most common sites of mold growth are the bathroom and the kitchen.  Outdoor (Seasonal) Mold Control  Positive  outdoor molds via skin testing: Alternaria, Cladosporium, Bipolaris (Helminthsporium), Drechslera (Curvalaria), Mucor, and Epicoccum  Use air conditioning and keep windows closed Avoid exposure to decaying vegetation. Avoid leaf raking. Avoid grain handling. Consider wearing a face mask if working in moldy areas.    Indoor (Perennial) Mold Control   Positive indoor molds via skin testing: Aspergillus, Penicillium, Fusarium, Aureobasidium (Pullulara), Rhizopus, and Phoma  Maintain humidity below 50%. Clean washable surfaces with 5% bleach solution. Remove sources e.g. contaminated carpets.

## 2022-08-23 ENCOUNTER — Encounter: Payer: Self-pay | Admitting: Nurse Practitioner

## 2022-08-23 ENCOUNTER — Ambulatory Visit (INDEPENDENT_AMBULATORY_CARE_PROVIDER_SITE_OTHER): Payer: No Typology Code available for payment source | Admitting: Nurse Practitioner

## 2022-08-23 VITALS — BP 122/76 | HR 76 | Ht 66.0 in | Wt 170.4 lb

## 2022-08-23 DIAGNOSIS — J4541 Moderate persistent asthma with (acute) exacerbation: Secondary | ICD-10-CM | POA: Diagnosis not present

## 2022-08-23 MED ORDER — IPRATROPIUM-ALBUTEROL 0.5-2.5 (3) MG/3ML IN SOLN: 3.0000 mL | RESPIRATORY_TRACT | 3 refills | Status: AC | PRN

## 2022-08-23 MED ORDER — BUDESONIDE 0.5 MG/2ML IN SUSP: 0.5000 mg | Freq: Every day | RESPIRATORY_TRACT | 3 refills | Status: AC

## 2022-08-23 NOTE — Assessment & Plan Note (Signed)
Sandra Joseph is experiencing an acute exacerbation of her asthma, most likely related to increased allergens and recent weather changes. Her right lower lobe does reveal decreased lung sounds on expiration, but air movement is present and oxygen saturations are normal. She is not in any distress at this time, which is reassuring. She is not having any pain, but has noticed tightness in the back most specifically on the right. Given that she is not having any fevers or chills, I do not feel there is an infectious component present.  Plan: - Start nebulized duoneb every 6 hours as needed and add pulmicort nebulized once a day. Continue Symbicort once a day. If improved symptoms with nebulizer over inhaler, hold symbicort and use nebulized pulmicort twice a day.  - Monitor very closely for any new or worsening symptoms including cough, chest pain, dizziness, or worsening shortness of breath. - Rinse mouth well after use of Symbicort and pulmicort to prevent yeast growth in the mouth.  - Notify if no improvement by tomorrow with use of nebulizer.

## 2022-08-23 NOTE — Progress Notes (Signed)
Orma Render, DNP, AGNP-c [address] [place], [place] [address] [phone]  Subjective:   Sandra Joseph is a 22 y.o. female presents to day for evaluation of: Asthma Exacerbation Sandra Joseph presents today with concerns regarding her asthma symptoms. She is currently using her Symbicort twice daily and Albuterol approximately four times a week immediately prior to exercise, as directed by her allergist. On Sunday, she began to experience new symptoms: pain in her back during regular breathing, chest tightness, and a feeling of shortness of breath with exertion. She emphasizes that these symptoms are new to her, describing the chest tightness at the peak of inhalation as feeling like a "balloon that needs to pop". She notes the symptoms are exacerbated with the cooler weather and were improved when the weather was warm. The patient denies any recent travel, chest pain, dizziness, fevers, cough, congestion, or nocturnal awakenings due to these symptoms. She asks if a nebulizer would provide more relief than her regular inhaler.    PMH, Medications, and Allergies reviewed and updated in chart as appropriate.   ROS negative except for what is listed in HPI. Objective:  BP 122/76   Pulse 76   Ht 5\' 6"  (1.676 m)   Wt 170 lb 6.4 oz (77.3 kg)   LMP 08/05/2022   SpO2 99%   BMI 27.50 kg/m  Physical Exam Vitals and nursing note reviewed.  Constitutional:      General: She is not in acute distress.    Appearance: Normal appearance. She is not ill-appearing.  HENT:     Head: Normocephalic.     Nose: Nose normal.     Mouth/Throat:     Mouth: Mucous membranes are moist.     Pharynx: Oropharynx is clear.  Eyes:     Pupils: Pupils are equal, round, and reactive to light.  Cardiovascular:     Rate and Rhythm: Normal rate and regular rhythm.     Pulses: Normal pulses.     Heart sounds: Normal heart sounds.  Pulmonary:     Effort: Pulmonary effort is normal.  No accessory muscle usage, prolonged expiration, respiratory distress or retractions.     Breath sounds: Decreased air movement present. Examination of the right-lower field reveals decreased breath sounds. Decreased breath sounds present. No wheezing.  Musculoskeletal:        General: Normal range of motion.     Cervical back: Normal range of motion.  Lymphadenopathy:     Cervical: No cervical adenopathy.  Skin:    General: Skin is warm and dry.     Capillary Refill: Capillary refill takes less than 2 seconds.  Neurological:     General: No focal deficit present.     Mental Status: She is alert.  Psychiatric:        Mood and Affect: Mood normal.           Assessment & Plan:   Problem List Items Addressed This Visit     Asthma - Primary    Sandra Joseph is experiencing an acute exacerbation of her asthma, most likely related to increased allergens and recent weather changes. Her right lower lobe does reveal decreased lung sounds on expiration, but air movement is present and oxygen saturations are normal. She is not in any distress at this time, which is reassuring. She is not having any pain, but has noticed tightness in the back most specifically on the right. Given that she is not having any fevers or chills, I do  not feel there is an infectious component present.  Plan: - Start nebulized duoneb every 6 hours as needed and add pulmicort nebulized once a day. Continue Symbicort once a day. If improved symptoms with nebulizer over inhaler, hold symbicort and use nebulized pulmicort twice a day.  - Monitor very closely for any new or worsening symptoms including cough, chest pain, dizziness, or worsening shortness of breath. - Rinse mouth well after use of Symbicort and pulmicort to prevent yeast growth in the mouth.  - Notify if no improvement by tomorrow with use of nebulizer.       Relevant Medications   ipratropium-albuterol (DUONEB) 0.5-2.5 (3) MG/3ML SOLN   budesonide (PULMICORT)  0.5 MG/2ML nebulizer solution   Other Relevant Orders   For home use only DME Nebulizer machine      Orma Render, DNP, AGNP-c 08/23/2022  8:33 PM    History, Medications, Surgery, SDOH, and Family History reviewed and updated as appropriate.

## 2022-09-05 ENCOUNTER — Ambulatory Visit: Payer: No Typology Code available for payment source | Admitting: Internal Medicine

## 2022-09-21 ENCOUNTER — Other Ambulatory Visit: Payer: Self-pay | Admitting: Family Medicine

## 2022-09-21 DIAGNOSIS — J4520 Mild intermittent asthma, uncomplicated: Secondary | ICD-10-CM

## 2022-09-21 DIAGNOSIS — H1010 Acute atopic conjunctivitis, unspecified eye: Secondary | ICD-10-CM

## 2022-09-21 NOTE — Telephone Encounter (Signed)
Is this okay to refill? 

## 2022-09-22 ENCOUNTER — Ambulatory Visit: Payer: No Typology Code available for payment source | Admitting: Allergy

## 2022-09-22 ENCOUNTER — Encounter: Payer: Self-pay | Admitting: Allergy

## 2022-09-22 ENCOUNTER — Other Ambulatory Visit: Payer: Self-pay

## 2022-09-22 VITALS — BP 130/88 | HR 70 | Temp 98.2°F | Resp 16 | Ht 66.54 in | Wt 171.7 lb

## 2022-09-22 DIAGNOSIS — J4520 Mild intermittent asthma, uncomplicated: Secondary | ICD-10-CM

## 2022-09-22 DIAGNOSIS — H1010 Acute atopic conjunctivitis, unspecified eye: Secondary | ICD-10-CM

## 2022-09-22 DIAGNOSIS — J309 Allergic rhinitis, unspecified: Secondary | ICD-10-CM

## 2022-09-22 DIAGNOSIS — H1013 Acute atopic conjunctivitis, bilateral: Secondary | ICD-10-CM

## 2022-09-22 DIAGNOSIS — T7800XD Anaphylactic reaction due to unspecified food, subsequent encounter: Secondary | ICD-10-CM | POA: Diagnosis not present

## 2022-09-22 DIAGNOSIS — T7800XA Anaphylactic reaction due to unspecified food, initial encounter: Secondary | ICD-10-CM

## 2022-09-22 MED ORDER — RYALTRIS 665-25 MCG/ACT NA SUSP: 2.0000 | Freq: Two times a day (BID) | NASAL | 5 refills | Status: AC

## 2022-09-22 NOTE — Progress Notes (Signed)
Follow-up Note  RE: KARINDA CABRIALES MRN: [id number] DOB: 2000-08-25 Date of Office Visit: 09/22/2022   History of present illness: Sandra Joseph is a 22 y.o. female presenting today for follow-up of asthma, allergic rhinitis with conjunctivitis and food allergy.  She was last seen in the office on 08/03/22 by Dr Marlynn Perking.    She states now with activity she is not noting as much of the chest tightness she used to have.  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  She may have to use albuterol once again during work day for symptom relief.  She is using Symbicort 1 puffs twice a day at this time with spacer device.    No nighttime symptoms.  No ED/UC visits or systemic steroid needs.    She is using one of 2 nasal sprays but not sure which.  She is taking montelukast.   She is taking zyrtec and may be working.  She does not some nasal symptoms still with congestion/drainage but overall improved symptoms.    She continues to avoid tree nuts without accidental ingestion or need for epinephrine use.    Review of systems: Review of Systems  Constitutional: Negative.   HENT:  Positive for congestion and postnasal drip.   Eyes: Negative.   Respiratory: Negative.    Cardiovascular: Negative.   Gastrointestinal: Negative.   Musculoskeletal: Negative.   Skin: Negative.   Allergic/Immunologic: Negative.   Neurological: Negative.      All other systems negative unless noted above in HPI  Past medical/social/surgical/family history have been reviewed and are unchanged unless specifically indicated below.  No changes  Medication List: Current Outpatient Medications  Medication Sig Dispense Refill   albuterol (VENTOLIN HFA) 108 (90 Base) MCG/ACT inhaler Inhale 2 puffs into the lungs every 6 (six) hours as needed for wheezing or shortness of  breath. 18 g 3   azelastine (ASTELIN) 0.1 % nasal spray Place 2 sprays into both nostrils 2 (two) times daily. Use in each nostril as directed 30 mL 12   budesonide (PULMICORT) 0.5 MG/2ML nebulizer solution Take 2 mLs (0.5 mg total) by nebulization daily. 60 mL 3   budesonide-formoterol (SYMBICORT) 80-4.5 MCG/ACT inhaler Inhale 2 puffs into the lungs in the morning and at bedtime. 1 each 12   cetirizine (ZYRTEC) 10 MG tablet Take 10 mg by mouth daily.     ipratropium-albuterol (DUONEB) 0.5-2.5 (3) MG/3ML SOLN Take 3 mLs by nebulization every 4 (four) hours as needed. 360 mL 3   montelukast (SINGULAIR) 10 MG tablet TAKE 1 TABLET BY MOUTH EVERY DAY 90 tablet 1   Olopatadine-Mometasone (RYALTRIS) 665-25 MCG/ACT SUSP Place 2 sprays into the nose 2 (two) times daily. 29 g 5   aspirin-acetaminophen-caffeine (EXCEDRIN MIGRAINE) 250-250-65 MG tablet Take 3 tablets by mouth every 6 (six) hours as needed for headache. (Patient not taking: Reported on 08/23/2022)     No current facility-administered medications for this visit.     Known medication allergies: Allergies  Allergen Reactions   Fexofenadine Rash and Hives     Physical examination: Blood pressure 130/88, pulse 70, temperature 98.2 F (36.8 C), temperature source Temporal, resp. rate 16, height 5' 6.54" (1.69 m), weight 171 lb 11.2 oz (77.9 kg), last menstrual period 08/05/2022, SpO2 100 %.  General: Alert, interactive, in no acute distress. HEENT: PERRLA, TMs pearly  gray, turbinates non-edematous without discharge, post-pharynx non erythematous. Neck: Supple without lymphadenopathy. Lungs: Clear to auscultation without wheezing, rhonchi or rales. {no increased work of breathing. CV: Normal S1, S2 without murmurs. Abdomen: Nondistended, nontender. Skin: Warm and dry, without lesions or rashes. Extremities:  No clubbing, cyanosis or edema. Neuro:   Grossly intact.  Diagnositics/Labs: Spirometry: FEV1: 3.02L 99%, FVC: 3.82L 109%, ratio  consistent with nonobstructive pattern  Assessment and plan: Mild persistent asthma: control is improved - your lung testing today looked great today  PLAN:  - Use inhaler with spacer device - Controller Inhaler: Symbicort  1 puff twice a day.  Rinse mouth out after use. If you have a respiratory illness or worsening symptoms then increase to 2 puffs twice a day for 1-2 weeks or until symptoms resolve then go back to 1 puff twice a day.  - Rescue Inhaler: Albuterol (Proair/Ventolin) 2 puffs. Use  every 4-6 hours as needed for chest tightness, wheezing, or coughing.  Can also use 15 minutes prior to exercise if you have symptoms with activity.  - Asthma is not controlled if:  - Symptoms are occurring >2 times a week OR  - >2 times a month nighttime awakenings  - You are requiring systemic steroids (prednisone/steroid injections) more than once per year  - Your require hospitalization for your asthma.  - Please call the clinic to schedule a follow up if these symptoms arise  Chronic Rhinitis Seasonal and Perennial Allergic: - Continue avoidance measure for grass pollen, weed pollen, tree pollen, mold, dust mite, cat, dog  - Prevention:  - consider allergy shots as long term control of your symptoms by teaching your immune system to be more tolerant of your allergy triggers  - Symptom control:  - will see if a combination spray, Ryaltris, if covered for you.  This is a combination spray with steroid (mometasome) and antihistamine (olopatadine) for congestion and drainage control respectively.   If this is not covered then continue as below. - Nasal Steroid Spray: Best results if used daily. - Options include Flonase (fluticasone), Nasocort (triamcinolone), Nasonex (mometasome) 1- 2 sprays in each nostril daily.  - All can be purchased over-the-counter if not covered by insurance. - Continue Astelin (azelastine) 1-2 sprays in each nostril twice a day as needed for nasal  drainage/itchy nose - Use less frequently if airway gets too dry. - Continue Singulair (Montelukast)  nightly.   - Discontinue if nightmares of behavior changes. - Continue Antihistamine: daily or daily as needed.   -Options include Zyrtec (Cetirizine)  or Xyzal (Levocetirinze)  - Can be purchased over-the-counter  Allergic Conjunctivitis:  - for itchy/watery eyes can use allergy Eye drops- Pataday (Olopatadine) daily as needed- this is an over the counter option  Food allergy:  - continue to avoid tree nuts, if you are interested in reintroducing we should get blood work at some point consider food challenge, but for now continue to strictly avoid - for SKIN only reaction, okay to take Benadryl 2 capsules every 4 hours - for SKIN + ANY additional symptoms, OR IF concern for LIFE THREATENING reaction = Epipen Autoinjector EpiPen 0.3 mg. - If using Epinephrine autoinjector, call 911 - A food allergy action plan has been provided and discussed. - Medic Alert identification is recommended.  Follow-up 6 months or sooner if needed  I appreciate the opportunity to take part in [facility] care. Please do not hesitate to contact me with questions.  Sincerely,   Margo Aye, MD Allergy/Immunology [facility]

## 2022-09-22 NOTE — Patient Instructions (Addendum)
Mild persistent asthma: control is improved - your lung testing today looked great today  PLAN:  - Use inhaler with spacer device - Controller Inhaler: Symbicort  1 puff twice a day.  Rinse mouth out after use. If you have a respiratory illness or worsening symptoms then increase to 2 puffs twice a day for 1-2 weeks or until symptoms resolve then go back to 1 puff twice a day.  - Rescue Inhaler: Albuterol (Proair/Ventolin) 2 puffs. Use  every 4-6 hours as needed for chest tightness, wheezing, or coughing.  Can also use 15 minutes prior to exercise if you have symptoms with activity.  - Asthma is not controlled if:  - Symptoms are occurring >2 times a week OR  - >2 times a month nighttime awakenings  - You are requiring systemic steroids (prednisone/steroid injections) more than once per year  - Your require hospitalization for your asthma.  - Please call the clinic to schedule a follow up if these symptoms arise  Chronic Rhinitis Seasonal and Perennial Allergic: - Continue avoidance measure for grass pollen, weed pollen, tree pollen, mold, dust mite, cat, dog  - Prevention:  - consider allergy shots as long term control of your symptoms by teaching your immune system to be more tolerant of your allergy triggers  - Symptom control:  - will see if a combination spray, Ryaltris, if covered for you.  This is a combination spray with steroid (mometasome) and antihistamine (olopatadine) for congestion and drainage control respectively.   If this is not covered then continue as below. - Nasal Steroid Spray: Best results if used daily. - Options include Flonase (fluticasone), Nasocort (triamcinolone), Nasonex (mometasome) 1- 2 sprays in each nostril daily.  - All can be purchased over-the-counter if not covered by insurance. - Continue Astelin (azelastine) 1-2 sprays in each nostril twice a day as needed for nasal drainage/itchy nose - Use less frequently if airway gets too dry. -  Continue Singulair (Montelukast)  nightly.   - Discontinue if nightmares of behavior changes. - Continue Antihistamine: daily or daily as needed.   -Options include Zyrtec (Cetirizine)  or Xyzal (Levocetirinze)  - Can be purchased over-the-counter  Allergic Conjunctivitis:  - for itchy/watery eyes can use allergy Eye drops- Pataday (Olopatadine) daily as needed- this is an over the counter option  Food allergy:  - continue to avoid tree nuts, if you are interested in reintroducing we should get blood work at some point consider food challenge, but for now continue to strictly avoid - for SKIN only reaction, okay to take Benadryl 2 capsules every 4 hours - for SKIN + ANY additional symptoms, OR IF concern for LIFE THREATENING reaction = Epipen Autoinjector EpiPen 0.3 mg. - If using Epinephrine autoinjector, call 911 - A food allergy action plan has been provided and discussed. - Medic Alert identification is recommended.  Follow-up 6 months or sooner if needed

## 2023-03-24 ENCOUNTER — Ambulatory Visit: Payer: No Typology Code available for payment source | Admitting: Allergy

## 2023-06-05 ENCOUNTER — Encounter: Payer: No Typology Code available for payment source | Admitting: Nurse Practitioner

## 2024-04-08 ENCOUNTER — Ambulatory Visit (INDEPENDENT_AMBULATORY_CARE_PROVIDER_SITE_OTHER): Admitting: Family Medicine

## 2024-04-08 VITALS — BP 126/88 | HR 66 | Wt 188.2 lb

## 2024-04-08 DIAGNOSIS — J069 Acute upper respiratory infection, unspecified: Secondary | ICD-10-CM | POA: Diagnosis not present

## 2024-04-08 DIAGNOSIS — J4541 Moderate persistent asthma with (acute) exacerbation: Secondary | ICD-10-CM | POA: Diagnosis not present

## 2024-04-08 DIAGNOSIS — H1010 Acute atopic conjunctivitis, unspecified eye: Secondary | ICD-10-CM | POA: Diagnosis not present

## 2024-04-08 DIAGNOSIS — J309 Allergic rhinitis, unspecified: Secondary | ICD-10-CM | POA: Diagnosis not present

## 2024-04-08 LAB — POC COVID19/FLU A&B COMBO
Covid Antigen, POC: NEGATIVE
Influenza A Antigen, POC: NEGATIVE
Influenza B Antigen, POC: NEGATIVE

## 2024-04-08 MED ORDER — AZITHROMYCIN 250 MG PO TABS: ORAL_TABLET | ORAL | 0 refills | Status: AC

## 2024-04-08 MED ORDER — METHYLPREDNISOLONE 4 MG PO TBPK: ORAL_TABLET | ORAL | 0 refills | Status: AC

## 2024-04-08 NOTE — Progress Notes (Signed)
   Name: Sandra Joseph Bound   Date of Visit: 04/08/24   Date of last visit with me: Visit date not found   CHIEF COMPLAINT:  Chief Complaint  Patient presents with   Acute Visit    Cough, congestion, runny nose.  Going on for 2 weeks.        HPI:  Discussed the use of AI scribe software for clinical note transcription with the patient, who gave verbal consent to proceed.  History of Present Illness Sandra Joseph is a 23 year old female with allergies and asthma who presents with worsening cough and hoarseness.  Over the past two weeks, she has experienced a persistent cough and loss of voice. The cough occurs throughout the day and worsens when she lies down, leading to congestion upon waking.  She has been using her nasal sprays, including Flonase, daily. She also reports experiencing chills over the last two days. No fever is present.  She has a history of allergies and asthma and is currently using her inhaler.     OBJECTIVE:       06/03/2022   10:19 AM  Depression screen PHQ 2/9  Decreased Interest 0  Down, Depressed, Hopeless 0  PHQ - 2 Score 0     BP Readings from Last 3 Encounters:  04/08/24 126/88  09/22/22 130/88  08/23/22 122/76    BP 126/88   Pulse 66   Wt 188 lb 3.2 oz (85.4 kg)   SpO2 99%   BMI 29.89 kg/m    Physical Exam HEENT: Pharynx erythematous.  Physical Exam Constitutional:      Appearance: Normal appearance.  HENT:     Nose: Mucosal edema, congestion and rhinorrhea present. No nasal deformity or signs of injury.     Mouth/Throat:     Pharynx: Posterior oropharyngeal erythema present. No oropharyngeal exudate.  Pulmonary:     Effort: Pulmonary effort is normal. No respiratory distress.     Breath sounds: No wheezing.  Neurological:     General: No focal deficit present.     Mental Status: She is alert and oriented to person, place, and time. Mental status is at baseline.     ASSESSMENT/PLAN:   Assessment & Plan Viral URI  with cough  Moderate persistent asthma with acute exacerbation  Allergic rhinoconjunctivitis    Assessment and Plan Assessment & Plan Upper Respiratory Infection with history of allergies Symptoms indicate upper respiratory infection. COVID-19 and flu tests negative. - Prescribed Medrol Dosepak for short course of steroids. - Prescribed azithromycin (Z-Pak). - Advised to contact provider if symptoms do not improve by Friday.     [name] Simone A. Vita MD [facility]

## 2024-04-08 NOTE — Addendum Note (Signed)
 Addended by: Byard Carranza on: 04/08/2024 10:46 AM   Modules accepted: Orders

## 2024-05-24 DIAGNOSIS — R0981 Nasal congestion: Secondary | ICD-10-CM | POA: Diagnosis not present

## 2024-05-24 DIAGNOSIS — U071 COVID-19: Secondary | ICD-10-CM | POA: Diagnosis not present

## 2024-06-07 ENCOUNTER — Telehealth: Payer: Self-pay | Admitting: Nurse Practitioner

## 2024-06-07 NOTE — Telephone Encounter (Signed)
 Pt said went to [facility] 05/24/24 had Covid & they wrote her out of work til 05/28/24, she brought by [facility] forms because Daris will not complete them. Put in your folder

## 2024-06-12 ENCOUNTER — Telehealth: Payer: Self-pay

## 2024-06-12 NOTE — Telephone Encounter (Signed)
 Do you need pt. To schedule an appt. Since it has been over a year and half since her last appt. With you.   Copied from [id number]. Topic: General - Other >> Jun 12, 2024  9:12 AM Sandra Joseph wrote: Reason for CRM: Patient called in regarding FMLA paperwork, patient stated she needs it as soon as possible 01/11 last day to accept  [phone]

## 2024-06-13 NOTE — Telephone Encounter (Signed)
 Appt scheduled for 1/16, needs forms sooner. I advised her that I would contact her if we had cancellation

## 2024-06-13 NOTE — Telephone Encounter (Signed)
 I was able to get her in tomorrow, Camie had a cancellation

## 2024-06-14 ENCOUNTER — Telehealth: Admitting: Nurse Practitioner

## 2024-06-14 VITALS — Wt 185.0 lb

## 2024-06-14 DIAGNOSIS — U071 COVID-19: Secondary | ICD-10-CM

## 2024-06-14 NOTE — Progress Notes (Signed)
 Virtual Visit Encounter mychart visit.   I connected with  Kimberlyann Hollar D Westrup on 06/14/2024 at 11:15 AM EST by secure video and audio telemedicine application. I verified that I am speaking with the correct person using two identifiers.   I introduced myself as a Publishing Rights Manager with the practice. The limitations of evaluation and management by telemedicine discussed with the patient and the availability of in person appointments. The patient expressed verbal understanding and consent to proceed.  Participating parties in this visit include: Myself and patient  The patient is: Patient Location: Home I am: Virtual Visit Location Provider: Office/Clinic  Subjective:    CC and HPI:   History of Present Illness Sandra Joseph is a 24 year old female who presents for a virtual visit to complete FMLA paperwork following a COVID-19 infection.  She tested positive for COVID-19 on May 24, 2024, after experiencing symptoms including congestion, dyspnea, fever, chills, myalgia, fatigue, and weakness. Initially, she thought she had a cold and took Robitussin, but after feeling sluggish and sore at work, she tested positive for COVID-19 and sought care at [facility] urgent care.  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 She was out of work from December 19 to May 28, 2024, during which time she received treatment with Paxlovid. She reported a dysgeusia from the medication but tolerated it well otherwise.   Since recovering, she has not experienced any further symptoms and has returned to work without issues.  Past medical history, Surgical history, Family history not pertinant except as noted below, Social history, Allergies, and medications have been entered into the medical record, reviewed, and corrections made.   Review of Systems:  All review of systems negative  except what is listed in the HPI  Objective:    Alert and oriented x 4 Speaking in clear sentences with no shortness of breath. No distress.  Impression and Recommendations:    Assessment & Plan COVID-19 Diagnosed on December 19th, 2024, with symptoms of feeling sluggish and sore. Unable to perform work duties due to illness, particularly in a food handling environment. Treated with Paxlovid, which was well-tolerated except for a metallic aftertaste. Symptoms resolved by December 23rd, 2024, and she has returned to work without further issues. - Completed and submitted FMLA paperwork for December 19th to December 23rd, 2025. - Included documentation of Paxlovid treatment in the paperwork.      current treatment plan is effective, no change in therapy I discussed the assessment and treatment plan with the patient. The patient was provided an opportunity to ask questions and all were answered. The patient agreed with the plan and demonstrated an understanding of the instructions.   The patient was advised to call back or seek an in-person evaluation if the symptoms worsen or if the condition fails to improve as anticipated.  Follow-Up: prn  I provided 5 minutes of non-face-to-face interaction with this non face-to-face encounter including intake, same-day documentation, and chart review.   Camie CHARLENA Doing, NP , DNP, AGNP-c [facility]

## 2024-06-21 ENCOUNTER — Ambulatory Visit: Admitting: Nurse Practitioner
